# Patient Record
Sex: Female | Born: 1990 | Race: White | Hispanic: No | Marital: Married | Smoking: Never smoker
Health system: Southern US, Community
[De-identification: ages and names within clinical notes are randomized; demographics above are authoritative.]

## PROBLEM LIST (undated history)

## (undated) DIAGNOSIS — R011 Cardiac murmur, unspecified: Secondary | ICD-10-CM

## (undated) DIAGNOSIS — O24419 Gestational diabetes mellitus in pregnancy, unspecified control: Secondary | ICD-10-CM

## (undated) DIAGNOSIS — G43909 Migraine, unspecified, not intractable, without status migrainosus: Secondary | ICD-10-CM

## (undated) DIAGNOSIS — R87629 Unspecified abnormal cytological findings in specimens from vagina: Secondary | ICD-10-CM

## (undated) DIAGNOSIS — Z8759 Personal history of other complications of pregnancy, childbirth and the puerperium: Secondary | ICD-10-CM

## (undated) DIAGNOSIS — I10 Essential (primary) hypertension: Secondary | ICD-10-CM

## (undated) DIAGNOSIS — R609 Edema, unspecified: Secondary | ICD-10-CM

## (undated) HISTORY — DX: Edema, unspecified: R60.9

## (undated) HISTORY — DX: Unspecified abnormal cytological findings in specimens from vagina: R87.629

## (undated) HISTORY — PX: WRIST SURGERY: SHX841

## (undated) HISTORY — DX: Gestational diabetes mellitus in pregnancy, unspecified control: O24.419

## (undated) HISTORY — PX: WISDOM TOOTH EXTRACTION: SHX21

## (undated) HISTORY — DX: Personal history of other complications of pregnancy, childbirth and the puerperium: Z87.59

## (undated) HISTORY — PX: SHOULDER SURGERY: SHX246

## (undated) HISTORY — DX: Cardiac murmur, unspecified: R01.1

## (undated) HISTORY — DX: Migraine, unspecified, not intractable, without status migrainosus: G43.909

---
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
## 2010-12-19 ENCOUNTER — Encounter: Payer: Self-pay | Admitting: Surgery

## 2011-01-02 ENCOUNTER — Encounter: Payer: Self-pay | Admitting: Surgery

## 2012-06-15 ENCOUNTER — Emergency Department: Payer: Self-pay | Admitting: Internal Medicine

## 2014-01-04 DIAGNOSIS — G43009 Migraine without aura, not intractable, without status migrainosus: Secondary | ICD-10-CM | POA: Insufficient documentation

## 2014-07-26 ENCOUNTER — Other Ambulatory Visit: Payer: Self-pay | Admitting: Physician Assistant

## 2014-07-26 DIAGNOSIS — R1011 Right upper quadrant pain: Secondary | ICD-10-CM

## 2014-07-27 ENCOUNTER — Ambulatory Visit
Admission: RE | Admit: 2014-07-27 | Discharge: 2014-07-27 | Disposition: A | Discharge status: Home / Self Care | Payer: BLUE CROSS/BLUE SHIELD | Source: Ambulatory Visit | Attending: Physician Assistant | Admitting: Physician Assistant

## 2014-07-27 DIAGNOSIS — R1011 Right upper quadrant pain: Secondary | ICD-10-CM | POA: Diagnosis present

## 2015-08-23 ENCOUNTER — Ambulatory Visit (INDEPENDENT_AMBULATORY_CARE_PROVIDER_SITE_OTHER): Payer: BLUE CROSS/BLUE SHIELD | Admitting: Obstetrics and Gynecology

## 2015-08-23 ENCOUNTER — Encounter: Payer: Self-pay | Admitting: Obstetrics and Gynecology

## 2015-08-23 VITALS — BP 106/69 | HR 101 | Ht 65.0 in | Wt 191.7 lb

## 2015-08-23 DIAGNOSIS — R8789 Other abnormal findings in specimens from female genital organs: Secondary | ICD-10-CM

## 2015-08-23 DIAGNOSIS — N870 Mild cervical dysplasia: Secondary | ICD-10-CM

## 2015-08-23 DIAGNOSIS — Z308 Encounter for other contraceptive management: Secondary | ICD-10-CM

## 2015-08-23 DIAGNOSIS — B977 Papillomavirus as the cause of diseases classified elsewhere: Secondary | ICD-10-CM

## 2015-08-23 DIAGNOSIS — IMO0002 Reserved for concepts with insufficient information to code with codable children: Secondary | ICD-10-CM | POA: Insufficient documentation

## 2015-08-23 NOTE — Progress Notes (Signed)
ANNUAL PREVENTATIVE CARE GYN  ENCOUNTER NOTE  Subjective:       Brandi Higgins is a 25 y.o. G0P0000 female here for a routine annual gynecologic exam.  Current complaints: 1.  Abnormal paps  2.   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 Previously followed by Dr. Luella Cookosenow at [facility]  Patient has been on oral contraceptives since high school. She is consistent in taking pills on a daily basis and wishes to move to an alternative long-term contraceptive.     Gynecologic History Patient's last menstrual period was 08/19/2015 (exact date). Contraception: OCP (estrogen/progesterone) Last Pap: 03/2015 neg/pos- WS. Results were: normal Last mammogram: n/a. Results were:  Menarche-age 62 Interval-monthly Duration-2-3 days on OCPs; 4 days off of OCPs Dysmenorrhea-negative Dyspareunia-negative STI history-negative except for HPV Currently in a monogamous relationship for the past 2-1/2 years  Obstetric History OB History  Gravida Para Term Preterm AB Living  0 0 0 0 0 0  SAB TAB Ectopic Multiple Live Births  0 0 0 0 0        Past Medical History:  Diagnosis Date  . Abnormal Pap smear of vagina   . Heart murmur   . Migraines   . Swelling     Past Surgical History:  Procedure Laterality Date  . SHOULDER SURGERY    . WISDOM TOOTH EXTRACTION    . WRIST SURGERY      No current outpatient prescriptions on file prior to visit.   No current facility-administered medications on file prior to visit.     No  Known Allergies  Social History   Social History  . Marital status: Single    Spouse name: N/A  . Number of children: N/A  . Years of education: N/A   Occupational History  . Not on file.   Social History Main Topics  . Smoking status: Not on file  . Smokeless tobacco: Not on file  . Alcohol use Not on file  . Drug use: Unknown  . Sexual activity: Not on file   Other Topics Concern  . Not on file   Social History Narrative  . No narrative on file    No family history on file.  The following portions of the patient's history were reviewed and updated as appropriate: allergies, current medications, past family history, past medical history, past social history, past surgical history and problem list.  Review of Systems ROS Menstrual cycles regular No history of dysmenorrhea or dyspareunia Inconsistent in taking OCPs No history of STI's No history of abnormal uterine bleeding No history of vaginal discharge   Objective:   BP 106/69   Pulse (!) 101   Ht 5\' 5"  (1.651 m)   Wt 191 lb 11.2 oz (87 kg)   LMP 08/19/2015 (Exact Date)   BMI 31.90 kg/m  CONSTITUTIONAL: Well-developed, well-nourished female in no acute distress.  Physical exam-deferred  Assessment:   1. Abnormal finding on Pap smear, HPV DNA positive  2. Encounter for other contraceptive management  3. Mild dysplasia of cervix      Plan:   1. Colposcopy with biopsies-08/25/2015 2. Mirena IUD insertion-09/21/2015 3. Long-acting contraceptive management options were discussed in detail 4. Management  of dysplasia was reviewed.  A total of 30 minutes were spent face-to-face with the patient during the encounter with greater than 50% dealing with counseling and coordination of care.  Herold HarmsMartin A Defrancesco, MD

## 2015-08-23 NOTE — Patient Instructions (Signed)
1. Colposcopy-08/25/2015 at 7:30 AM. 2. Take 800 mg ibuprofen or 2 Aleve prior to procedure 3. IUD insertion-09/21/2015

## 2015-08-25 ENCOUNTER — Ambulatory Visit (INDEPENDENT_AMBULATORY_CARE_PROVIDER_SITE_OTHER): Payer: BLUE CROSS/BLUE SHIELD | Admitting: Obstetrics and Gynecology

## 2015-08-25 ENCOUNTER — Encounter: Payer: Self-pay | Admitting: Obstetrics and Gynecology

## 2015-08-25 VITALS — BP 132/80 | HR 97 | Wt 190.3 lb

## 2015-08-25 DIAGNOSIS — IMO0002 Reserved for concepts with insufficient information to code with codable children: Secondary | ICD-10-CM

## 2015-08-25 DIAGNOSIS — B977 Papillomavirus as the cause of diseases classified elsewhere: Secondary | ICD-10-CM

## 2015-08-25 DIAGNOSIS — N870 Mild cervical dysplasia: Secondary | ICD-10-CM

## 2015-08-25 DIAGNOSIS — R888 Abnormal findings in other body fluids and substances: Secondary | ICD-10-CM

## 2015-08-25 NOTE — Progress Notes (Signed)
PROCEDURE: Colposcopy of upper adjacent vagina with cervical biopsies Indications: 1. High-risk HPV Pap smear 2. History of LGSIL Pap  Findings: Adequate colposcopy with visualization of the SCJ Thin rim of white epithelium from 12:00 through 1:00 through 3:00 Nabothian cyst at 9:00  Biopsies: Pap smear Cervix 12:00 Cervix 3:00  Description: Normal consent is obtained. Patient was placed in dorsal lithotomy position Graves' speculum was placed into the vagina to facilitate cervix visualization. Pap smear is obtained.  Acetic acid is applied to cervix and vagina. Colposcopic was used to visualize abnormal lesions as noted above using both normal and filter lenses. Cervical biopsy at 12:00 and 3:00 is obtained with Tischler forceps. Monsel solution is applied. Blood loss is minimal. Complications none. Procedure was well-tolerated.  Assessment: 1. 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 History of LGSIL Pap  PLAN: 1. Pap smear 2. Cervical biopsy 12:00 3. Cervical biopsy 3:00 4. Findings will be given to patient when available 5. Return in 6 months for repeat colposcopy 6. Patient is to return for IUD insertion on 09/21/2015  Herold HarmsMartin A Emerly Prak, MD  Note: This dictation was prepared with Dragon dictation along with smaller phrase technology. Any transcriptional errors that result from this process are unintentional.

## 2015-08-25 NOTE — Patient Instructions (Signed)
Colposcopy, Care After Refer to this sheet in the next few weeks. These instructions provide you with information on caring for yourself after your procedure. Your health care provider may also give you more specific instructions. Your treatment has been planned according to current medical practices, but problems sometimes occur. Call your health care provider if you have any problems or questions after your procedure. WHAT TO EXPECT AFTER THE PROCEDURE  After your procedure, it is typical to have the following:  Cramping. This often goes away in a few minutes.  Soreness. This may last for 2 days.  Lightheadedness. Lie down for a few minutes if this occurs. You may also have some bleeding or dark discharge for a few days. You may need to wear a sanitary pad during this time. HOME CARE INSTRUCTIONS  Avoid sex, douching, and using tampons for 3 days or as directed by your health care provider.  Only take over-the-counter or prescription medicines as directed by your health care provider. Do not take aspirin because it can cause bleeding.  Continue to take birth control pills if you are on them.  Not all test results are available during your visit. If your test results are not back during the visit, make an appointment with your health care provider to find out the results. Do not assume everything is normal if you have not heard from your health care provider or the medical facility. It is important for you to follow up on all of your test results.  Follow your health care provider's advice regarding activity, follow-up visits, and follow-up Pap tests. SEEK MEDICAL CARE IF:  You develop a rash.  You have problems with your medicine. SEEK IMMEDIATE MEDICAL CARE IF:  You are bleeding heavily or are passing blood clots.  You have a fever.  You have abnormal vaginal discharge.  You are having cramps that do not go away after taking your pain medicine.  You feel lightheaded, dizzy, or  faint.  You have stomach pain.   This information is not intended to replace advice given to you by your health care provider. Make sure you discuss any questions you have with your health care provider.   Document Released: 10/08/2012 Document Reviewed: 10/08/2012 Elsevier Interactive Patient Education [employer]2016 Elsevier Inc.   1. Follow-up in 6 months for colposcopy

## 2015-08-29 ENCOUNTER — Encounter: Payer: Self-pay | Admitting: Unknown Physician Specialty

## 2015-08-29 ENCOUNTER — Encounter: Payer: Self-pay | Admitting: Obstetrics and Gynecology

## 2015-08-30 LAB — PAP IG W/ RFLX HPV ASCU: PAP Smear Comment: 0

## 2015-08-31 LAB — PATHOLOGY

## 2015-09-20 ENCOUNTER — Telehealth: Payer: Self-pay | Admitting: Obstetrics and Gynecology

## 2015-09-20 MED ORDER — NORGESTIM-ETH ESTRAD TRIPHASIC 0.18/0.215/0.25 MG-35 MCG PO TABS: 1.0000 | ORAL_TABLET | Freq: Every day | ORAL | 11 refills | Status: DC

## 2015-09-20 NOTE — Telephone Encounter (Signed)
PT HAD AN APPT TOMORROW FOR IUD INSERTION AND SHE HAD TO GO OUT OF TOWN UNEXPECTED AND SHE WANTED TO KNOW IF BC CAN BE CALLED IN FOR HER SHE USES [facility] AND SHE SAID THAT [facility] PUT HER ON ORTHO TRI CYCLEN SO SHE WANTED TO SEE IF THAT CAN BE CALLED IN.

## 2015-09-20 NOTE — Telephone Encounter (Signed)
Pt aware ok per mad to erx ortho tri cyclen. Med erx. Pt is currently taking. Will call back when she is ready for iud insertion.

## 2015-09-21 ENCOUNTER — Ambulatory Visit: Payer: BLUE CROSS/BLUE SHIELD | Admitting: Obstetrics and Gynecology

## 2016-05-23 DIAGNOSIS — R0683 Snoring: Secondary | ICD-10-CM | POA: Diagnosis not present

## 2016-05-23 DIAGNOSIS — R0789 Other chest pain: Secondary | ICD-10-CM | POA: Diagnosis not present

## 2016-05-23 DIAGNOSIS — R51 Headache: Secondary | ICD-10-CM | POA: Diagnosis not present

## 2016-06-25 DIAGNOSIS — J019 Acute sinusitis, unspecified: Secondary | ICD-10-CM | POA: Diagnosis not present

## 2016-06-27 DIAGNOSIS — R0683 Snoring: Secondary | ICD-10-CM | POA: Diagnosis not present

## 2016-07-01 DIAGNOSIS — G4733 Obstructive sleep apnea (adult) (pediatric): Secondary | ICD-10-CM | POA: Diagnosis not present

## 2016-07-09 ENCOUNTER — Other Ambulatory Visit: Payer: Self-pay | Admitting: Obstetrics and Gynecology

## 2016-11-08 ENCOUNTER — Other Ambulatory Visit: Payer: Self-pay

## 2016-11-21 ENCOUNTER — Encounter: Payer: Self-pay | Admitting: Obstetrics and Gynecology

## 2016-11-21 ENCOUNTER — Ambulatory Visit (INDEPENDENT_AMBULATORY_CARE_PROVIDER_SITE_OTHER): Payer: BLUE CROSS/BLUE SHIELD | Admitting: Obstetrics and Gynecology

## 2016-11-21 VITALS — BP 113/72 | HR 79 | Ht 65.0 in | Wt 199.4 lb

## 2016-11-21 DIAGNOSIS — K219 Gastro-esophageal reflux disease without esophagitis: Secondary | ICD-10-CM | POA: Diagnosis not present

## 2016-11-21 DIAGNOSIS — N870 Mild cervical dysplasia: Secondary | ICD-10-CM

## 2016-11-21 DIAGNOSIS — Z308 Encounter for other contraceptive management: Secondary | ICD-10-CM | POA: Diagnosis not present

## 2016-11-21 DIAGNOSIS — Z01419 Encounter for gynecological examination (general) (routine) without abnormal findings: Secondary | ICD-10-CM | POA: Diagnosis not present

## 2016-11-21 DIAGNOSIS — R600 Localized edema: Secondary | ICD-10-CM | POA: Diagnosis not present

## 2016-11-21 DIAGNOSIS — G43719 Chronic migraine without aura, intractable, without status migrainosus: Secondary | ICD-10-CM | POA: Diagnosis not present

## 2016-11-21 MED ORDER — NORGESTIM-ETH ESTRAD TRIPHASIC 0.18/0.215/0.25 MG-25 MCG PO TABS: 1.0000 | ORAL_TABLET | Freq: Every day | ORAL | 3 refills | Status: DC

## 2016-11-21 NOTE — Patient Instructions (Signed)
1.  Pap smear is done 2.  Self breast awareness is encouraged 3.  Screening labs are to be obtained through primary care 4.  Refill birth control pills 5.  Continue with healthy eating, exercise, and control weight loss 6.  Return in 1 year for annual exam  Health Maintenance, Female Adopting a healthy lifestyle and getting preventive care can go a long way to promote health and wellness. Talk with your health care provider about what schedule of regular examinations is right for you. This is a good chance for you to check in with your provider about disease prevention and staying healthy. In between checkups, there are plenty of things you can do on your own. Experts have done a lot of research about which lifestyle changes and preventive measures are most likely to keep you healthy. Ask your health care provider for more information. Weight and diet Eat a healthy diet  Be sure to include plenty of vegetables, fruits, low-fat dairy products, and lean protein.  Do not eat a lot of foods high in solid fats, added sugars, or salt.  Get regular exercise. This is one of the most important things you can do for your health. ? Most adults should exercise for at least 150 minutes each week. The exercise should increase your heart rate and make you sweat (moderate-intensity exercise). ? Most adults should also do strengthening exercises at least twice a week. This is in addition to the moderate-intensity exercise.  Maintain a healthy weight  Body mass index (BMI) is a measurement that can be used to identify possible weight problems. It estimates body fat based on height and weight. Your health care provider can help determine your BMI and help you achieve or maintain a healthy weight.  For females 38 years of age and older: ? A BMI below 18.5 is considered underweight. ? A BMI of 18.5 to 24.9 is normal. ? A BMI of 25 to 29.9 is considered overweight. ? A BMI of 30 and above is considered  obese.  Watch levels of cholesterol and blood lipids  You should start having your blood tested for lipids and cholesterol at 28 years of age, then have this test every 5 years.  You may need to have your cholesterol levels checked more often if: ? Your lipid or cholesterol levels are high. ? You are older than 28 years of age. ? You are at high risk for heart disease.  Cancer screening Lung Cancer  Lung cancer screening is recommended for adults 73-12 years old who are at high risk for lung cancer because of a history of smoking.  A yearly low-dose CT scan of the lungs is recommended for people who: ? Currently smoke. ? Have quit within the past 15 years. ? Have at least a 30-pack-year history of smoking. A pack year is smoking an average of one pack of cigarettes a day for 1 year.  Yearly screening should continue until it has been 15 years since you quit.  Yearly screening should stop if you develop a health problem that would prevent you from having lung cancer treatment.  Breast Cancer  Practice breast self-awareness. This means understanding how your breasts normally appear and feel.  It also means doing regular breast self-exams. Let your health care provider know about any changes, no matter how small.  If you are in your 20s or 30s, you should have a clinical breast exam (CBE) by a health care provider every 1-3 years as part of  a regular health exam.  If you are 19 or older, have a CBE every year. Also consider having a breast X-ray (mammogram) every year.  If you have a family history of breast cancer, talk to your health care provider about genetic screening.  If you are at high risk for breast cancer, talk to your health care provider about having an MRI and a mammogram every year.  Breast cancer gene (BRCA) assessment is recommended for women who have family members with BRCA-related cancers. BRCA-related cancers  include: ? Breast. ? Ovarian. ? Tubal. ? Peritoneal cancers.  Results of the assessment will determine the need for genetic counseling and BRCA1 and BRCA2 testing.  Cervical Cancer Your health care provider may recommend that you be screened regularly for cancer of the pelvic organs (ovaries, uterus, and vagina). This screening involves a pelvic examination, including checking for microscopic changes to the surface of your cervix (Pap test). You may be encouraged to have this screening done every 3 years, beginning at age 34.  For women ages 4-65, health care providers may recommend pelvic exams and Pap testing every 3 years, or they may recommend the Pap and pelvic exam, combined with testing for human papilloma virus (HPV), every 5 years. Some types of HPV increase your risk of cervical cancer. Testing for HPV may also be done on women of any age with unclear Pap test results.  Other health care providers may not recommend any screening for nonpregnant women who are considered low risk for pelvic cancer and who do not have symptoms. Ask your health care provider if a screening pelvic exam is right for you.  If you have had past treatment for cervical cancer or a condition that could lead to cancer, you need Pap tests and screening for cancer for at least 20 years after your treatment. If Pap tests have been discontinued, your risk factors (such as having a new sexual partner) need to be reassessed to determine if screening should resume. Some women have medical problems that increase the chance of getting cervical cancer. In these cases, your health care provider may recommend more frequent screening and Pap tests.  Colorectal Cancer  This type of cancer can be detected and often prevented.  Routine colorectal cancer screening usually begins at 28 years of age and continues through 28 years of age.  Your health care provider may recommend screening at an earlier age if you have risk factors  for colon cancer.  Your health care provider may also recommend using home test kits to check for hidden blood in the stool.  A small camera at the end of a tube can be used to examine your colon directly (sigmoidoscopy or colonoscopy). This is done to check for the earliest forms of colorectal cancer.  Routine screening usually begins at age 37.  Direct examination of the colon should be repeated every 5-10 years through 28 years of age. However, you may need to be screened more often if early forms of precancerous polyps or small growths are found.  Skin Cancer  Check your skin from head to toe regularly.  Tell your health care provider about any new moles or changes in moles, especially if there is a change in a mole's shape or color.  Also tell your health care provider if you have a mole that is larger than the size of a pencil eraser.  Always use sunscreen. Apply sunscreen liberally and repeatedly throughout the day.  Protect yourself by wearing long sleeves,  pants, a wide-brimmed hat, and sunglasses whenever you are outside.  Heart disease, diabetes, and high blood pressure  High blood pressure causes heart disease and increases the risk of stroke. High blood pressure is more likely to develop in: ? People who have blood pressure in the high end of the normal range (130-139/85-89 mm Hg). ? People who are overweight or obese. ? People who are African American.  If you are 18-39 years of age, have your blood pressure checked every 3-5 years. If you are 40 years of age or older, have your blood pressure checked every year. You should have your blood pressure measured twice-once when you are at a hospital or clinic, and once when you are not at a hospital or clinic. Record the average of the two measurements. To check your blood pressure when you are not at a hospital or clinic, you can use: ? An automated blood pressure machine at a pharmacy. ? A home blood pressure monitor.  If  you are between 55 years and 79 years old, ask your health care provider if you should take aspirin to prevent strokes.  Have regular diabetes screenings. This involves taking a blood sample to check your fasting blood sugar level. ? If you are at a normal weight and have a low risk for diabetes, have this test once every three years after 28 years of age. ? If you are overweight and have a high risk for diabetes, consider being tested at a younger age or more often. Preventing infection Hepatitis B  If you have a higher risk for hepatitis B, you should be screened for this virus. You are considered at high risk for hepatitis B if: ? You were born in a country where hepatitis B is common. Ask your health care provider which countries are considered high risk. ? Your parents were born in a high-risk country, and you have not been immunized against hepatitis B (hepatitis B vaccine). ? You have HIV or AIDS. ? You use needles to inject street drugs. ? You live with someone who has hepatitis B. ? You have had sex with someone who has hepatitis B. ? You get hemodialysis treatment. ? You take certain medicines for conditions, including cancer, organ transplantation, and autoimmune conditions.  Hepatitis C  Blood testing is recommended for: ? Everyone born from 1945 through 1965. ? Anyone with known risk factors for hepatitis C.  Sexually transmitted infections (STIs)  You should be screened for sexually transmitted infections (STIs) including gonorrhea and chlamydia if: ? You are sexually active and are younger than 28 years of age. ? You are older than 28 years of age and your health care provider tells you that you are at risk for this type of infection. ? Your sexual activity has changed since you were last screened and you are at an increased risk for chlamydia or gonorrhea. Ask your health care provider if you are at risk.  If you do not have HIV, but are at risk, it may be recommended  that you take a prescription medicine daily to prevent HIV infection. This is called pre-exposure prophylaxis (PrEP). You are considered at risk if: ? You are sexually active and do not regularly use condoms or know the HIV status of your partner(s). ? You take drugs by injection. ? You are sexually active with a partner who has HIV.  Talk with your health care provider about whether you are at high risk of being infected with HIV. If you   choose to begin PrEP, you should first be tested for HIV. You should then be tested every 3 months for as long as you are taking PrEP. Pregnancy  If you are premenopausal and you may become pregnant, ask your health care provider about preconception counseling.  If you may become pregnant, take 400 to 800 micrograms (mcg) of folic acid every day.  If you want to prevent pregnancy, talk to your health care provider about birth control (contraception). Osteoporosis and menopause  Osteoporosis is a disease in which the bones lose minerals and strength with aging. This can result in serious bone fractures. Your risk for osteoporosis can be identified using a bone density scan.  If you are 65 years of age or older, or if you are at risk for osteoporosis and fractures, ask your health care provider if you should be screened.  Ask your health care provider whether you should take a calcium or vitamin D supplement to lower your risk for osteoporosis.  Menopause may have certain physical symptoms and risks.  Hormone replacement therapy may reduce some of these symptoms and risks. Talk to your health care provider about whether hormone replacement therapy is right for you. Follow these instructions at home:  Schedule regular health, dental, and eye exams.  Stay current with your immunizations.  Do not use any tobacco products including cigarettes, chewing tobacco, or electronic cigarettes.  If you are pregnant, do not drink alcohol.  If you are  breastfeeding, limit how much and how often you drink alcohol.  Limit alcohol intake to no more than 1 drink per day for nonpregnant women. One drink equals 12 ounces of beer, 5 ounces of wine, or 1 ounces of hard liquor.  Do not use street drugs.  Do not share needles.  Ask your health care provider for help if you need support or information about quitting drugs.  Tell your health care provider if you often feel depressed.  Tell your health care provider if you have ever been abused or do not feel safe at home. This information is not intended to replace advice given to you by your health care provider. Make sure you discuss any questions you have with your health care provider. Document Released: 07/03/2010 Document Revised: 05/26/2015 Document Reviewed: 09/21/2014 Elsevier Interactive Patient Education  [employer].

## 2016-11-21 NOTE — Progress Notes (Signed)
ANNUAL PREVENTATIVE CARE GYN  ENCOUNTER NOTE  Subjective:       Brandi Higgins is a 26 y.o. G0P0000 female here for a routine annual gynecologic exam.  Current complaints: 1. None 2.  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   Gynecologic History Patient's last menstrual period was 11/21/2016 (exact date). Contraception: OCP (estrogen/progesterone) Last Pap: 08/26/2015 neg. Results were: normal Last mammogram: n/a. Results were:   Obstetric History OB History  Gravida Para Term Preterm AB Living  0 0 0 0 0 0  SAB TAB Ectopic Multiple Live Births  0 0 0 0 0        Past Medical History:  Diagnosis Date  . Abnormal Pap smear of vagina   . Heart murmur   . Migraines   . Swelling     Past Surgical History:  Procedure Laterality Date  . SHOULDER SURGERY    . WISDOM TOOTH EXTRACTION    . WRIST SURGERY      Current Outpatient Medications on File Prior to Visit  Medication Sig Dispense Refill  . furosemide (LASIX) 20 MG tablet Take by mouth.    . Norgestimate-Ethinyl Estradiol Triphasic 0.18/0.215/0.25 MG-25 MCG tab Take by mouth.     No current facility-administered medications on file prior to visit.     No Known Allergies  Social History   Socioeconomic History  . Marital status: Single    Spouse name: Not on file  . Number of children: Not on file  . Years of education: Not on file  . Highest education level: Not on file  Social Needs  . Financial resource strain: Not on file  . Food insecurity - worry: Not on file  . Food insecurity - inability: Not on file  . Transportation needs - medical: Not on file  . Transportation needs - non-medical: Not on file  Occupational History  . Not on file  Tobacco Use  . Smoking status: Never Smoker  . Smokeless tobacco: Never Used  Substance and Sexual Activity  . Alcohol use: Not on file    Comment: occas  . Drug use: No  . Sexual activity: Yes    Birth  control/protection: Pill  Other Topics Concern  . Not on file  Social History Narrative  . Not on file    Family History  Problem Relation Age of Onset  . Diabetes Maternal Grandmother   . Diabetes Paternal Grandmother   . Cancer Neg Hx   . Heart disease Neg Hx     The following portions of the patient's history were reviewed and updated as appropriate: allergies, current medications, past family history, past medical history, past social history, past surgical history and problem list.  Review of Systems ROS Review of Systems - General ROS: negative for - chills, fatigue, fever, hot flashes, night sweats, weight gain or weight loss Psychological ROS: negative for - anxiety, decreased libido, depression, mood swings, physical abuse or sexual abuse Ophthalmic ROS: negative for - blurry vision, eye pain or loss of vision ENT ROS: negative for - headaches, hearing change, visual changes or vocal changes Allergy and Immunology ROS: negative for - hives, itchy/watery eyes or seasonal allergies Hematological and Lymphatic ROS: negative for - bleeding problems, bruising, swollen lymph nodes or weight loss Endocrine ROS: negative for - galactorrhea, hair pattern changes, hot flashes, malaise/lethargy, mood swings, palpitations, polydipsia/polyuria, skin changes, temperature intolerance or unexpected weight changes Breast ROS: negative for - new or changing breast lumps  or nipple discharge Respiratory ROS: negative for - cough or shortness of breath Cardiovascular ROS: negative for - chest pain, irregular heartbeat, palpitations or shortness of breath Gastrointestinal ROS: no abdominal pain, change in bowel habits, or black or bloody stools Genito-Urinary ROS: no dysuria, trouble voiding, or hematuria Musculoskeletal ROS: negative for - joint pain or joint stiffness Neurological ROS: negative for - bowel and bladder control changes Dermatological ROS: negative for rash and skin lesion  changes   Objective:   BP 113/72   Pulse 79   Ht 5\' 5"  (1.651 m)   Wt 199 lb 6.4 oz (90.4 kg)   LMP 11/21/2016 (Exact Date)   BMI 33.18 kg/m  CONSTITUTIONAL: Well-developed, well-nourished female in no acute distress.  PSYCHIATRIC: Normal mood and affect. Normal behavior. Normal judgment and thought content. NEUROLGIC: Alert and oriented to person, place, and time. Normal muscle tone coordination. No cranial nerve deficit noted. HENT:  Normocephalic, atraumatic, External right and left ear normal. Oropharynx is clear and moist EYES: Conjunctivae and EOM are normal. No scleral icterus.  NECK: Normal range of motion, supple, no masses.  Normal thyroid.  SKIN: Skin is warm and dry. No rash noted. Not diaphoretic. No erythema. No pallor. CARDIOVASCULAR: Normal heart rate noted, regular rhythm, no murmur. RESPIRATORY: Clear to auscultation bilaterally. Effort and breath sounds normal, no problems with respiration noted. BREASTS: Symmetric in size. No masses, skin changes, nipple drainage, or lymphadenopathy. ABDOMEN: Soft, normal bowel sounds, no distention noted.  No tenderness, rebound or guarding.  BLADDER: Normal PELVIC:  External Genitalia: Normal  BUS: Normal  Vagina: Normal; menstrual blood in vault  cervix: Normal; no lesions; no cervical motion tenderness  Uterus: Normal; midplane, normal size shape, mobile, nontender  Adnexa: Normal; nonpalpable nontender  RV: External Exam NormaI  MUSCULOSKELETAL: Normal range of motion. No tenderness.  No cyanosis, clubbing, or edema.  2+ distal pulses. LYMPHATIC: No Axillary, Supraclavicular, or Inguinal Adenopathy.    Assessment:   Annual gynecologic examination 26 y.o. Contraception: OCP (estrogen/progesterone) Obesity 1 Problem List Items Addressed This Visit    None    History of cervical dysplasia  Plan:  Pap: Pap, Reflex if ASCUS Mammogram: Not Indicated Stool Guaiac Testing:  Not Indicated Labs: thru pcp Routine  preventative health maintenance measures emphasized: Exercise/Diet/Weight control, Tobacco Warnings and Alcohol/Substance use risks Refill birth control pills Return to Clinic - 1 Year   Crystal AmericusMiller, CMA  Herold HarmsMartin A Defrancesco, MD  Note: This dictation was prepared with Dragon dictation along with smaller phrase technology. Any transcriptional errors that result from this process are unintentional.

## 2016-11-26 LAB — PAP IG W/ RFLX HPV ASCU: PAP SMEAR COMMENT: 0

## 2016-12-12 ENCOUNTER — Other Ambulatory Visit: Payer: Self-pay

## 2016-12-12 MED ORDER — NORGESTIM-ETH ESTRAD TRIPHASIC 0.18/0.215/0.25 MG-35 MCG PO TABS: 1.0000 | ORAL_TABLET | Freq: Every day | ORAL | 3 refills | Status: DC

## 2017-01-23 ENCOUNTER — Encounter: Payer: Self-pay | Admitting: Obstetrics and Gynecology

## 2017-01-23 ENCOUNTER — Other Ambulatory Visit: Payer: Self-pay

## 2017-01-23 ENCOUNTER — Telehealth: Payer: Self-pay | Admitting: Obstetrics and Gynecology

## 2017-01-23 MED ORDER — NORGESTIM-ETH ESTRAD TRIPHASIC 0.18/0.215/0.25 MG-35 MCG PO TABS: 1.0000 | ORAL_TABLET | Freq: Every day | ORAL | 0 refills | Status: DC

## 2017-01-23 NOTE — Telephone Encounter (Addendum)
See my chart message

## 2017-01-23 NOTE — Telephone Encounter (Signed)
The patient called and stated that she is currently out of town and she forgot her B.C. The patient would like to have her B.C refilled or sent to a pharmacy near the location she is now. The patient did not disclose any other information. Please advise.

## 2017-03-26 DIAGNOSIS — G5601 Carpal tunnel syndrome, right upper limb: Secondary | ICD-10-CM | POA: Diagnosis not present

## 2017-04-01 DIAGNOSIS — M25531 Pain in right wrist: Secondary | ICD-10-CM | POA: Diagnosis not present

## 2017-04-01 DIAGNOSIS — R200 Anesthesia of skin: Secondary | ICD-10-CM | POA: Diagnosis not present

## 2017-04-15 ENCOUNTER — Other Ambulatory Visit: Payer: Self-pay | Admitting: Physician Assistant

## 2017-04-15 DIAGNOSIS — G5603 Carpal tunnel syndrome, bilateral upper limbs: Secondary | ICD-10-CM

## 2017-04-18 DIAGNOSIS — M25531 Pain in right wrist: Secondary | ICD-10-CM | POA: Diagnosis not present

## 2017-04-25 DIAGNOSIS — M25531 Pain in right wrist: Secondary | ICD-10-CM | POA: Diagnosis not present

## 2017-04-26 ENCOUNTER — Ambulatory Visit: Payer: BLUE CROSS/BLUE SHIELD

## 2017-06-06 DIAGNOSIS — M25531 Pain in right wrist: Secondary | ICD-10-CM | POA: Diagnosis not present

## 2017-08-19 DIAGNOSIS — M79672 Pain in left foot: Secondary | ICD-10-CM | POA: Diagnosis not present

## 2017-08-19 DIAGNOSIS — M79671 Pain in right foot: Secondary | ICD-10-CM | POA: Diagnosis not present

## 2017-08-19 DIAGNOSIS — M722 Plantar fascial fibromatosis: Secondary | ICD-10-CM | POA: Diagnosis not present

## 2017-12-04 DIAGNOSIS — H6123 Impacted cerumen, bilateral: Secondary | ICD-10-CM | POA: Diagnosis not present

## 2017-12-04 DIAGNOSIS — H938X1 Other specified disorders of right ear: Secondary | ICD-10-CM | POA: Diagnosis not present

## 2017-12-06 ENCOUNTER — Other Ambulatory Visit: Payer: Self-pay | Admitting: Obstetrics and Gynecology

## 2017-12-13 ENCOUNTER — Encounter: Payer: Self-pay | Admitting: Obstetrics and Gynecology

## 2017-12-13 ENCOUNTER — Ambulatory Visit (INDEPENDENT_AMBULATORY_CARE_PROVIDER_SITE_OTHER): Payer: BLUE CROSS/BLUE SHIELD | Admitting: Obstetrics and Gynecology

## 2017-12-13 VITALS — BP 128/84 | HR 88 | Ht 65.0 in | Wt 204.6 lb

## 2017-12-13 DIAGNOSIS — E669 Obesity, unspecified: Secondary | ICD-10-CM | POA: Diagnosis not present

## 2017-12-13 DIAGNOSIS — Z01419 Encounter for gynecological examination (general) (routine) without abnormal findings: Secondary | ICD-10-CM | POA: Diagnosis not present

## 2017-12-13 MED ORDER — NORGESTIM-ETH ESTRAD TRIPHASIC 0.18/0.215/0.25 MG-35 MCG PO TABS: 1.0000 | ORAL_TABLET | Freq: Every day | ORAL | 4 refills | Status: DC

## 2017-12-13 NOTE — Patient Instructions (Signed)
Preventive Care 18-39 Years, Female Preventive care refers to lifestyle choices and visits with your health care provider that can promote health and wellness. What does preventive care include?  A yearly physical exam. This is also called an annual well check.  Dental exams once or twice a year.  Routine eye exams. Ask your health care provider how often you should have your eyes checked.  Personal lifestyle choices, including: ? Daily care of your teeth and gums. ? Regular physical activity. ? Eating a healthy diet. ? Avoiding tobacco and drug use. ? Limiting alcohol use. ? Practicing safe sex. ? Taking vitamin and mineral supplements as recommended by your health care provider. What happens during an annual well check? The services and screenings done by your health care provider during your annual well check will depend on your age, overall health, lifestyle risk factors, and family history of disease. Counseling Your health care provider may ask you questions about your:  Alcohol use.  Tobacco use.  Drug use.  Emotional well-being.  Home and relationship well-being.  Sexual activity.  Eating habits.  Work and work Statistician.  Method of birth control.  Menstrual cycle.  Pregnancy history.  Screening You may have the following tests or measurements:  Height, weight, and BMI.  Diabetes screening. This is done by checking your blood sugar (glucose) after you have not eaten for a while (fasting).  Blood pressure.  Lipid and cholesterol levels. These may be checked every 5 years starting at age 38.  Skin check.  Hepatitis C blood test.  Hepatitis B blood test.  Sexually transmitted disease (STD) testing.  BRCA-related cancer screening. This may be done if you have a family history of breast, ovarian, tubal, or peritoneal cancers.  Pelvic exam and Pap test. This may be done every 3 years starting at age 38. Starting at age 30, this may be done  every 5 years if you have a Pap test in combination with an HPV test.  Discuss your test results, treatment options, and if necessary, the need for more tests with your health care provider. Vaccines Your health care provider may recommend certain vaccines, such as:  Influenza vaccine. This is recommended every year.  Tetanus, diphtheria, and acellular pertussis (Tdap, Td) vaccine. You may need a Td booster every 10 years.  Varicella vaccine. You may need this if you have not been vaccinated.  HPV vaccine. If you are 39 or younger, you may need three doses over 6 months.  Measles, mumps, and rubella (MMR) vaccine. You may need at least one dose of MMR. You may also need a second dose.  Pneumococcal 13-valent conjugate (PCV13) vaccine. You may need this if you have certain conditions and were not previously vaccinated.  Pneumococcal polysaccharide (PPSV23) vaccine. You may need one or two doses if you smoke cigarettes or if you have certain conditions.  Meningococcal vaccine. One dose is recommended if you are age 68-21 years and a first-year college student living in a residence hall, or if you have one of several medical conditions. You may also need additional booster doses.  Hepatitis A vaccine. You may need this if you have certain conditions or if you travel or work in places where you may be exposed to hepatitis A.  Hepatitis B vaccine. You may need this if you have certain conditions or if you travel or work in places where you may be exposed to hepatitis B.  Haemophilus influenzae type b (Hib) vaccine. You may need this  if you have certain risk factors.  Talk to your health care provider about which screenings and vaccines you need and how often you need them. This information is not intended to replace advice given to you by your health care provider. Make sure you discuss any questions you have with your health care provider. Document Released: 02/13/2001 Document Revised:  09/07/2015 Document Reviewed: 10/19/2014 Elsevier Interactive Patient Education  2018 Elsevier Inc.  

## 2017-12-13 NOTE — Progress Notes (Signed)
  Subjective:     Brandi Higgins is a engaged white 27 y.o. female and is here for a comprehensive physical exam. 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 The patient reports no problems.  Social History   Socioeconomic History  . Marital status: Single    Spouse name: Not on file  . Number of children: Not on file  . Years of education: Not on file  . Highest education level: Not on file  Occupational History  . Not on file  Social Needs  . Financial resource strain: Not on file  . Food insecurity:    Worry: Not on file    Inability: Not on file  . Transportation needs:    Medical: Not on file    Non-medical: Not on file  Tobacco Use  . Smoking status: Never Smoker  . Smokeless tobacco: Never Used  Substance and Sexual Activity  . Alcohol use: Yes    Comment: occas  . Drug use: No  . Sexual activity: Yes    Birth control/protection: Pill  Lifestyle  . Physical activity:    Days per week: 2 days    Minutes per session: 20 min  . Stress: Not on file  Relationships  . Social connections:    Talks on phone: Not on file    Gets together: Not on file    Attends religious service: Not on file    Active member of club or organization: Not on file    Attends meetings of clubs or organizations: Not on file    Relationship status: Not on file  . Intimate partner violence:    Fear of current or ex partner: No    Emotionally abused: No    Physically abused: No    Forced sexual activity: No  Other Topics Concern  . Not on file  Social History Narrative  . Not on file   Health Maintenance  Topic Date Due  . HIV Screening  06/10/2003  . TETANUS/TDAP  06/10/2007  . PAP-Cervical Cytology Screening  06/09/2009  . INFLUENZA VACCINE  04/01/2018 (Originally 08/01/2017)  . PAP SMEAR-Modifier  11/22/2019    The following portions of the patient's history were reviewed and updated as appropriate: allergies, current medications, past family history, past  medical history, past social history, past surgical history and problem list.  Review of Systems A comprehensive review of systems was negative.   Objective:    General appearance: alert, cooperative and appears stated age Neck: no adenopathy, no carotid bruit, no JVD, supple, symmetrical, trachea midline and thyroid not enlarged, symmetric, no tenderness/mass/nodules Lungs: clear to auscultation bilaterally Breasts: normal appearance, no masses or tenderness Heart: regular rate and rhythm, S1, S2 normal, no murmur, click, rub or gallop Abdomen: soft, non-tender; bowel sounds normal; no masses,  no organomegaly Pelvic: cervix normal in appearance, external genitalia normal, no adnexal masses or tenderness, no cervical motion tenderness, rectovaginal septum normal, uterus normal size, shape, and consistency and vagina normal without discharge    Assessment:    Healthy female exam.  OCP use obesity     Plan:  RTC 1 year or as needed.   Melody Shambley,CNM   See After Visit Summary for Counseling Recommendations

## 2017-12-18 LAB — PAP IG AND HPV HIGH-RISK: HPV, high-risk: NEGATIVE

## 2018-07-11 ENCOUNTER — Encounter: Payer: BLUE CROSS/BLUE SHIELD | Admitting: Obstetrics and Gynecology

## 2018-08-28 DIAGNOSIS — M67833 Other specified disorders of tendon, right wrist: Secondary | ICD-10-CM | POA: Diagnosis not present

## 2018-11-21 DIAGNOSIS — H52229 Regular astigmatism, unspecified eye: Secondary | ICD-10-CM | POA: Diagnosis not present

## 2018-11-21 DIAGNOSIS — Z135 Encounter for screening for eye and ear disorders: Secondary | ICD-10-CM | POA: Diagnosis not present

## 2018-12-01 ENCOUNTER — Other Ambulatory Visit: Payer: Self-pay

## 2018-12-01 DIAGNOSIS — Z20822 Contact with and (suspected) exposure to covid-19: Secondary | ICD-10-CM

## 2018-12-02 LAB — NOVEL CORONAVIRUS, NAA: SARS-CoV-2, NAA: NOT DETECTED

## 2019-01-08 ENCOUNTER — Telehealth: Payer: Self-pay | Admitting: Certified Nurse Midwife

## 2019-01-08 NOTE — Telephone Encounter (Signed)
Pt called in and canceled appt said she will does everything at next appt.

## 2019-01-09 ENCOUNTER — Encounter: Payer: Self-pay | Admitting: Certified Nurse Midwife

## 2019-01-13 ENCOUNTER — Telehealth: Payer: Self-pay | Admitting: Certified Nurse Midwife

## 2019-01-28 NOTE — Telephone Encounter (Signed)
error 

## 2019-02-27 ENCOUNTER — Encounter: Payer: Self-pay | Admitting: Certified Nurse Midwife

## 2019-03-04 ENCOUNTER — Encounter: Payer: Self-pay | Admitting: Certified Nurse Midwife

## 2019-03-26 ENCOUNTER — Encounter: Payer: BLUE CROSS/BLUE SHIELD | Admitting: Family Medicine

## 2019-03-30 ENCOUNTER — Emergency Department
Admission: EM | Admit: 2019-03-30 | Discharge: 2019-03-30 | Disposition: A | Discharge status: Home / Self Care | Payer: 59 | Attending: Emergency Medicine | Admitting: Emergency Medicine

## 2019-03-30 ENCOUNTER — Emergency Department: Payer: 59

## 2019-03-30 ENCOUNTER — Other Ambulatory Visit: Payer: Self-pay

## 2019-03-30 DIAGNOSIS — Z79899 Other long term (current) drug therapy: Secondary | ICD-10-CM | POA: Diagnosis not present

## 2019-03-30 DIAGNOSIS — Z3A01 Less than 8 weeks gestation of pregnancy: Secondary | ICD-10-CM | POA: Insufficient documentation

## 2019-03-30 DIAGNOSIS — O209 Hemorrhage in early pregnancy, unspecified: Secondary | ICD-10-CM | POA: Insufficient documentation

## 2019-03-30 DIAGNOSIS — O4690 Antepartum hemorrhage, unspecified, unspecified trimester: Secondary | ICD-10-CM

## 2019-03-30 DIAGNOSIS — O208 Other hemorrhage in early pregnancy: Secondary | ICD-10-CM | POA: Diagnosis not present

## 2019-03-30 DIAGNOSIS — Z3A08 8 weeks gestation of pregnancy: Secondary | ICD-10-CM | POA: Diagnosis not present

## 2019-03-30 LAB — CBC
HCT: 40.8 % (ref 36.0–46.0)
Hemoglobin: 13.8 g/dL (ref 12.0–15.0)
MCH: 29.8 pg (ref 26.0–34.0)
MCHC: 33.8 g/dL (ref 30.0–36.0)
MCV: 88.1 fL (ref 80.0–100.0)
Platelets: 300 10*3/uL (ref 150–400)
RBC: 4.63 MIL/uL (ref 3.87–5.11)
RDW: 12.7 % (ref 11.5–15.5)
WBC: 10 10*3/uL (ref 4.0–10.5)
nRBC: 0.2 % (ref 0.0–0.2)

## 2019-03-30 LAB — BASIC METABOLIC PANEL
Anion gap: 9 (ref 5–15)
BUN: 9 mg/dL (ref 6–20)
CO2: 23 mmol/L (ref 22–32)
Calcium: 8.9 mg/dL (ref 8.9–10.3)
Chloride: 106 mmol/L (ref 98–111)
Creatinine, Ser: 0.79 mg/dL (ref 0.44–1.00)
GFR calc Af Amer: >60 mL/min (ref >60)
GFR calc non Af Amer: >60 mL/min (ref >60)
Glucose, Bld: 120 mg/dL — ABNORMAL HIGH (ref 70–99)
Potassium: 3.4 mmol/L — ABNORMAL LOW (ref 3.5–5.1)
Sodium: 138 mmol/L (ref 135–145)

## 2019-03-30 LAB — HCG, QUANTITATIVE, PREGNANCY: hCG, Beta Chain, Quant, S: 607 m[IU]/mL — ABNORMAL HIGH (ref <5)

## 2019-03-30 LAB — ABO/RH: ABO/RH(D): A POS

## 2019-03-30 LAB — POCT PREGNANCY, URINE: Preg Test, Ur: POSITIVE — AB

## 2019-03-30 NOTE — ED Notes (Signed)
Pt presents to the ED for vaginal bleedings. Denies abd pain. Pt states she think she approx. [redacted] weeks pregnant. States she woke up and was bleeding. She hasn't been using the pads or tampons to measure the bleeding amount. Denies NVD. Pt is A&Ox4 and NAD.

## 2019-03-30 NOTE — ED Provider Notes (Signed)
[facility] Emergency Department Provider Note   ____________________________________________    I have reviewed the triage vital signs and the nursing notes.   HISTORY  Chief Complaint Vaginal Bleeding      HPI Brandi Higgins is a 29 y.o. female who reports she is approximately [redacted] weeks pregnant presents with vaginal bleeding.  Patient reports this is her first pregnancy.  Patient reports bleeding started yesterday, initially was spotting then this morning was slightly heavier.  Denies abdominal cramping or pain.  No nausea or vomiting.  No fever.  No dysuria.  Has not seen her OB yet for initial appointment, scheduled in April.  Past Medical History:  Diagnosis Date  . Abnormal Pap smear of vagina   . Heart murmur   . Migraines   . Swelling     Patient Active Problem List   Diagnosis Date Noted  . 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 Migraine without aura and without status migrainosus, not intractable 01/04/2014    Past Surgical History:  Procedure Laterality Date  . SHOULDER SURGERY    . WISDOM TOOTH EXTRACTION    . WRIST SURGERY      Prior to Admission medications   Medication Sig Start Date End Date Taking? Authorizing Provider  furosemide (LASIX) 20 MG tablet Take by mouth. 05/06/15 11/21/16  [provider]  Norgestimate-Ethinyl Estradiol Triphasic (TRI-PREVIFEM) 0.18/0.215/0.25 MG-35 MCG tablet Take 1 tablet by mouth daily. 12/13/17   Joylene Igo, CNM     Allergies Patient has no known allergies.  Family History  Problem Relation Age of Onset  . Diabetes Maternal Grandmother   . Diabetes Paternal Grandmother   . Cancer Neg Hx   . Heart disease Neg Hx     Social History Social History   Tobacco Use  . Smoking status: Never Smoker  . Smokeless tobacco: Never Used  Substance Use Topics  . Alcohol use: Not Currently    Comment: occas  . Drug use: No    Review of  Systems  Constitutional: No fever/chills Eyes: No visual changes.  ENT: No sore throat. Cardiovascular: Denies chest pain. Respiratory: Denies shortness of breath. Gastrointestinal: As above Genitourinary: As above Musculoskeletal: Negative for back pain. Skin: Negative for rash. Neurological: Negative for headaches or weakness   ____________________________________________   PHYSICAL EXAM:  VITAL SIGNS: ED Triage Vitals  Enc Vitals Group     BP 03/30/19 0816 (!) 153/94     Pulse Rate 03/30/19 0816 (!) 110     Resp 03/30/19 0816 18     Temp 03/30/19 1147 98.6 F (37 C)     Temp Source 03/30/19 1147 Oral     SpO2 03/30/19 0816 98 %     Weight 03/30/19 0817 90.7 kg (200 lb)     Height 03/30/19 0817 1.651 m (5\' 5" )     Head Circumference --      Peak Flow --      Pain Score 03/30/19 0816 1     Pain Loc --      Pain Edu? --      Excl. in [place]? --     Constitutional: Alert and oriented.  Nose: No congestion/rhinnorhea. Mouth/Throat: Mucous membranes are moist.    Cardiovascular: Normal rate, regular rhythm.  Good peripheral circulation. Respiratory: Normal respiratory effort.  No retractions.. Gastrointestinal: Soft and nontender. No distention.  No CVA tenderness.  Reassuring exam Genitourinary: deferred Musculoskeletal: No lower extremity tenderness nor edema.  Warm and well perfused Neurologic:  Normal speech and language. No gross focal neurologic deficits are appreciated.  Skin:  Skin is warm, dry and intact. No rash noted. Psychiatric: Mood and affect are normal. Speech and behavior are normal.  ____________________________________________   LABS (all labs ordered are listed, but only abnormal results are displayed)  Labs Reviewed  BASIC METABOLIC PANEL - Abnormal; Notable for the following components:      Result Value   Potassium 3.4 (*)    Glucose, Bld 120 (*)    All other components within normal limits  HCG, QUANTITATIVE, PREGNANCY - Abnormal;  Notable for the following components:   hCG, Beta Chain, Quant, S 607 (*)    All other components within normal limits  POCT PREGNANCY, URINE - Abnormal; Notable for the following components:   Preg Test, Ur POSITIVE (*)    All other components within normal limits  CBC  ABO/RH   ____________________________________________  EKG  None ____________________________________________  RADIOLOGY  Ultrasound demonstrates probable early intrauterine gestational sac ____________________________________________   PROCEDURES  Procedure(s) performed: No  Procedures   Critical Care performed: No ____________________________________________   INITIAL IMPRESSION / ASSESSMENT AND PLAN / ED COURSE  Pertinent labs & imaging results that were available during my care of the patient were reviewed by me and considered in my medical decision making (see chart for details).  Patient presents with vaginal bleeding for trimester of pregnancy.  hCG is 607, ultrasound demonstrates probable early intrauterine gestational sac.  Differential includes possible cystic bleeding, miscarriage, subchorionic hemorrhage  Patient will need repeat hCG in 2 to 3 days.  She is Rh+.  She will call her OB to schedule for close follow-up.    ____________________________________________   FINAL CLINICAL IMPRESSION(S) / ED DIAGNOSES  Final diagnoses:  Vaginal bleeding in pregnancy        Note:  This document was prepared using Dragon voice recognition software and may include unintentional dictation errors.   Jene Every, MD 03/30/19 1228

## 2019-03-30 NOTE — ED Triage Notes (Signed)
Pt states she is about [redacted] weeks pregnant and woke up to vaginal bleeding with small blots and mild lower abd pain. States this is her first pregnancy.

## 2019-04-01 ENCOUNTER — Other Ambulatory Visit: Payer: 59

## 2019-04-01 ENCOUNTER — Other Ambulatory Visit: Payer: Self-pay

## 2019-04-01 DIAGNOSIS — Z3201 Encounter for pregnancy test, result positive: Secondary | ICD-10-CM | POA: Diagnosis not present

## 2019-04-01 DIAGNOSIS — O3680X0 Pregnancy with inconclusive fetal viability, not applicable or unspecified: Secondary | ICD-10-CM

## 2019-04-02 ENCOUNTER — Telehealth: Payer: Self-pay | Admitting: *Deleted

## 2019-04-02 ENCOUNTER — Inpatient Hospital Stay (HOSPITAL_COMMUNITY)
Admission: AD | Admit: 2019-04-02 | Discharge: 2019-04-02 | Disposition: A | Discharge status: Home / Self Care | Payer: 59 | Attending: Obstetrics & Gynecology | Admitting: Obstetrics & Gynecology

## 2019-04-02 ENCOUNTER — Other Ambulatory Visit: Payer: Self-pay

## 2019-04-02 ENCOUNTER — Encounter (HOSPITAL_COMMUNITY): Payer: Self-pay | Admitting: Obstetrics & Gynecology

## 2019-04-02 DIAGNOSIS — O0281 Inappropriate change in quantitative human chorionic gonadotropin (hCG) in early pregnancy: Secondary | ICD-10-CM | POA: Insufficient documentation

## 2019-04-02 DIAGNOSIS — Z3A01 Less than 8 weeks gestation of pregnancy: Secondary | ICD-10-CM | POA: Diagnosis not present

## 2019-04-02 LAB — BETA HCG QUANT (REF LAB): hCG Quant: 589 m[IU]/mL

## 2019-04-02 NOTE — MAU Note (Signed)
Went to the ER on Mon, because she was bleeding.  Had blood work and [place]. Had blood work repeated on Wed. Level did not change much.  Was told to come in if increased pain or bleeding.  Come tomorrow for repeat level. Is bleeding more today.  Feels fine, denies pain. "is in the dark, as to what is going on".

## 2019-04-02 NOTE — MAU Provider Note (Signed)
First Provider Initiated Contact with Patient 04/02/19 1709      S [name] is a 29 y.o. G18P0000 non-pregnant female who presents to MAU today for labs. She is early pregnant & has had HCGs checked this week in the hospital & the office ([facility]). HCG on Monday was 607, & yesterday was 589. Ultrasound on Monday showed not pregnancy or ectopic.  Patient was instructed today that she needs to come to MAU tomorrow for blood work. She came today because she didn't understand what her labs meant & what she needed to do.  Denies abdominal pain. Reports slight increase in bleeding but only needs to change her pad twice per day. Not saturating or passing clots.    O BP 133/82 (BP Location: Right Arm)   Pulse 89   Temp 98.4 F (36.9 C) (Oral)   Resp 17   Ht 5\' 5"  (1.651 m)   Wt 97.1 kg   LMP 02/02/2019 (Exact Date)   SpO2 98%   BMI 35.61 kg/m  Physical Exam  Nursing note and vitals reviewed. Constitutional: She appears well-developed and well-nourished. No distress.  Respiratory: Effort normal. No respiratory distress.  Skin: She is not diaphoretic.  Psychiatric: She has a normal mood and affect. Her behavior is normal. Thought content normal.    MDM No pain. Vital signs stable. Bleeding stable.  Had discussion with patient regarding implications of previous HCG levels. They are not rising, so we know the pregnancy is not viable - but unsure at this point if miscarriage vs ectopic. Educated regarding what ectopic pregnancy meant, and different treatment options if that's what her diagnosis is.  Gave patient option of repeat labs today vs returning tomorrow at the 48 hr point for HCG per Dr. 04/02/2019 recommendation. Patient will return tomorrow for labs.  Reviewed ectopic precautions & reasons to return.   A 1. Inappropriate change in quantitative hCG in early pregnancy      P Discharge home in stable condition Strict return precautions reviewed Patient to return to MAU  tomorrow for stat HCG Given educational material on ectopic pregnancy & methotrexate treatment for patient to review - this is not her diagnosis at this time.   Mont Dutton, NP 04/02/2019 6:49 PM

## 2019-04-02 NOTE — Discharge Instructions (Signed)
Methotrexate Treatment for an Ectopic Pregnancy  Methotrexate is a medicine that treats an ectopic pregnancy. An ectopic pregnancy is a pregnancy in which the fetus develops outside the uterus. This kind of pregnancy can be dangerous. Methotrexate works by stopping the growth of the fertilized egg. It also helps your body absorb tissue from the egg. This takes between 2-6 weeks. Most ectopic pregnancies can be successfully treated with methotrexate if they are diagnosed early. Tell a health care provider about:  Any allergies you have.  All medicines you are taking, including vitamins, herbs, eye drops, creams, and over-the-counter medicines.  Any medical conditions you have. What are the risks? Generally, this is a safe treatment. However, problems may occur, including:  Nausea or vomiting or both.  Vaginal bleeding or spotting.  Diarrhea.  Abdominal cramping.  Dizziness or feeling lightheaded.  Mouth sores.  Swelling or irritation of the lining of your lungs (pneumonitis).  Liver damage.  Hair loss. There is a risk that methotrexate treatment will fail and your pregnancy will continue. There is also a risk that the ectopic pregnancy might rupture while you are using this medicine. What happens before the procedure?  Liver tests, kidney tests, and a complete blood test will be done.  Blood tests will be done to measure the pregnancy hormone levels and to determine your blood type.  If you are Rh-negative and the father is Rh-positive or his Rh type is not known, you will be given a Rho (D) immune globulin shot. What happens during the procedure? Your health care provider may give you methotrexate by injection or in the form of a pill. Methotrexate may be given as a single dose of medicine or a series of doses, depending on your response to the treatment.  Methotrexate injections will be given by your health care provider. This is the most common way that methotrexate is used  to treat an ectopic pregnancy.  If you are prescribed oral methotrexate, it is very important that you follow your health care provider's instructions on how to take oral methotrexate. Additional medicines may be needed to manage an ectopic pregnancy. The procedure may vary among health care providers and hospitals. What happens after the procedure?  You may have abdominal cramping, vaginal bleeding, and fatigue.  Blood tests will be taken at timed intervals for several days or weeks to check your pregnancy hormone levels. The blood tests will be done until the pregnancy hormone can no longer be detected in the blood.  You may need to have a surgical procedure to remove the ectopic pregnancy if methotrexate treatment fails.  Follow instructions from your health care provider on how and when to report any symptoms that may indicate a ruptured ectopic pregnancy. Summary  Methotrexate is a medicine that treats an ectopic pregnancy.  Methotrexate may be given in a single dose or a series of doses over time.  Blood tests will be taken at timed intervals for several days or weeks to check your pregnancy hormone levels. The blood tests will be done until no more pregnancy hormone is detected in the blood.  There is a risk that methotrexate treatment will fail and your pregnancy will continue. There is also a risk that the ectopic pregnancy might rupture while you are using this medicine. This information is not intended to replace advice given to you by your health care provider. Make sure you discuss any questions you have with your health care provider. Document Revised: 11/30/2016 Document Reviewed: 02/07/2016 Elsevier Patient   Education  [employer]. Ectopic Pregnancy  An ectopic pregnancy is when the fertilized egg attaches (implants) outside the uterus. Most ectopic pregnancies occur in one of the tubes where eggs travel from the ovary to the uterus (fallopian tubes), but the  implanting can occur in other locations. In rare cases, ectopic pregnancies occur on the ovary, intestine, pelvis, abdomen, or cervix. In an ectopic pregnancy, the fertilized egg does not have the ability to develop into a normal, healthy baby. A ruptured ectopic pregnancy is one in which tearing or bursting of a fallopian tube causes internal bleeding. Often, there is intense lower abdominal pain, and vaginal bleeding sometimes occurs. Having an ectopic pregnancy can be life-threatening. If this dangerous condition is not treated, it can lead to blood loss, shock, or even death. What are the causes? The most common cause of this condition is damage to one of the fallopian tubes. A fallopian tube may be narrowed or blocked, and that keeps the fertilized egg from reaching the uterus. What increases the risk? This condition is more likely to develop in women of childbearing age who have different levels of risk. The levels of risk can be divided into three categories. High risk  You have gone through infertility treatment.  You have had an ectopic pregnancy before.  You have had surgery on the fallopian tubes, or another surgical procedure, such as an abortion.  You have had surgery to have the fallopian tubes tied (tubal ligation).  You have problems or diseases of the fallopian tubes.  You have been exposed to diethylstilbestrol (DES). This medicine was used until 1971, and it had effects on babies whose mothers took the medicine.  You become pregnant while using an IUD (intrauterine device) for birth control. Moderate risk  You have a history of infertility.  You have had an STI (sexually transmitted infection).  You have a history of pelvic inflammatory disease (PID).  You have scarring from endometriosis.  You have multiple sexual partners.  You smoke. Low risk  You have had pelvic surgery.  You use vaginal douches.  You became sexually active before age 67. What are the  signs or symptoms? Common symptoms of this condition include normal pregnancy symptoms, such as missing a period, nausea, tiredness, abdominal pain, breast tenderness, and bleeding. However, ectopic pregnancy will have additional symptoms, such as:  Pain with intercourse.  Irregular vaginal bleeding or spotting.  Cramping or pain on one side or in the lower abdomen.  Fast heartbeat, low blood pressure, and sweating.  Passing out while having a bowel movement. Symptoms of a ruptured ectopic pregnancy and internal bleeding may include:  Sudden, severe pain in the abdomen and pelvis.  Dizziness, weakness, light-headedness, or fainting.  Pain in the shoulder or neck area. How is this diagnosed? This condition is diagnosed by:  A pelvic exam to locate pain or a mass in the abdomen.  A pregnancy test. This blood test checks for the presence as well as the specific level of pregnancy hormone in the bloodstream.  Ultrasound. This is performed if a pregnancy test is positive. In this test, a probe is inserted into the vagina. The probe will detect a fetus, possibly in a location other than the uterus.  Taking a sample of uterus tissue (dilation and curettage, or D&C).  Surgery to perform a visual exam of the inside of the abdomen using a thin, lighted tube that has a tiny camera on the end (laparoscope).  Culdocentesis. This procedure involves inserting  a needle at the top of the vagina, behind the uterus. If blood is present in this area, it may indicate that a fallopian tube is torn. How is this treated? This condition is treated with medicine or surgery. Medicine  An injection of a medicine (methotrexate) may be given to cause the pregnancy tissue to be absorbed. This medicine may save your fallopian tube. It may be given if: ? The diagnosis is made early, with no signs of active bleeding. ? The fallopian tube has not ruptured. ? You are considered to be a good candidate for the  medicine. Usually, pregnancy hormone blood levels are checked after methotrexate treatment. This is to be sure that the medicine is effective. It may take 4-6 weeks for the pregnancy to be absorbed. Most pregnancies will be absorbed by 3 weeks. Surgery  A laparoscope may be used to remove the pregnancy tissue.  If severe internal bleeding occurs, a larger cut (incision) may be made in the lower abdomen (laparotomy) to remove the fetus and placenta. This is done to stop the bleeding.  Part or all of the fallopian tube may be removed (salpingectomy) along with the fetus and placenta. The fallopian tube may also be repaired during the surgery.  In very rare circumstances, removal of the uterus (hysterectomy) may be required.  After surgery, pregnancy hormone testing may be done to be sure that there is no pregnancy tissue left. Whether your treatment is medicine or surgery, you may receive a Rho (D) immune globulin shot to prevent problems with any future pregnancy. This shot may be given if:  You are Rh-negative and the baby's father is Rh-positive.  You are Rh-negative and you do not know the Rh type of the baby's father. Follow these instructions at home:  Rest and limit your activity after the procedure for as long as told by your health care provider.  Until your health care provider says that it is safe: ? Do not lift anything that is heavier than 10 lb (4.5 kg), or the limit that your health care provider tells you. ? Avoid physical exercise and any movement that requires effort (is strenuous).  To help prevent constipation: ? Eat a healthy diet that includes fruits, vegetables, and whole grains. ? Drink 6-8 glasses of water per day. Get help right away if:  You develop worsening pain that is not relieved by medicine.  You have: ? A fever or chills. ? Vaginal bleeding. ? Redness and swelling at the incision site. ? Nausea and vomiting.  You feel dizzy or weak.  You feel  light-headed or you faint. This information is not intended to replace advice given to you by your health care provider. Make sure you discuss any questions you have with your health care provider. Document Revised: 11/30/2016 Document Reviewed: 07/20/2015 Elsevier Patient Education  [place].

## 2019-04-02 NOTE — Telephone Encounter (Signed)
Called pt after speaking with Dr Macon Large about her HCG results. Informed pt that she will need to go to MAU for a stat HCG to rule out ectopic pregnancy tomorrow unless she becomes symptomatic then she needs to go today or tonight. Pt verbalizes and understands. MAU provider made aware.

## 2019-04-03 ENCOUNTER — Inpatient Hospital Stay (HOSPITAL_COMMUNITY): Payer: 59

## 2019-04-03 ENCOUNTER — Encounter (HOSPITAL_COMMUNITY): Payer: Self-pay | Admitting: Obstetrics & Gynecology

## 2019-04-03 ENCOUNTER — Inpatient Hospital Stay (HOSPITAL_COMMUNITY)
Admission: AD | Admit: 2019-04-03 | Discharge: 2019-04-03 | Disposition: A | Discharge status: Home / Self Care | Payer: 59 | Source: Ambulatory Visit | Attending: Family Medicine | Admitting: Family Medicine

## 2019-04-03 ENCOUNTER — Other Ambulatory Visit: Payer: Self-pay

## 2019-04-03 DIAGNOSIS — O26891 Other specified pregnancy related conditions, first trimester: Secondary | ICD-10-CM | POA: Insufficient documentation

## 2019-04-03 DIAGNOSIS — R109 Unspecified abdominal pain: Secondary | ICD-10-CM

## 2019-04-03 DIAGNOSIS — O3680X0 Pregnancy with inconclusive fetal viability, not applicable or unspecified: Secondary | ICD-10-CM | POA: Diagnosis not present

## 2019-04-03 DIAGNOSIS — Z3A01 Less than 8 weeks gestation of pregnancy: Secondary | ICD-10-CM | POA: Diagnosis not present

## 2019-04-03 DIAGNOSIS — O26851 Spotting complicating pregnancy, first trimester: Secondary | ICD-10-CM | POA: Diagnosis not present

## 2019-04-03 DIAGNOSIS — R1031 Right lower quadrant pain: Secondary | ICD-10-CM | POA: Insufficient documentation

## 2019-04-03 LAB — HCG, QUANTITATIVE, PREGNANCY: hCG, Beta Chain, Quant, S: 939 m[IU]/mL — ABNORMAL HIGH (ref <5)

## 2019-04-03 NOTE — Discharge Instructions (Signed)
Return to MAU:  If you have heavier bleeding that soaks through more that 2 pads per hour for an hour or more  If you bleed so much that you feel like you might pass out or you do pass out  If you have significant abdominal pain that is not improved with Tylenol 1000 mg  If you develop a fever > 100.5

## 2019-04-03 NOTE — MAU Note (Signed)
Pt here for repeat blood work.  States feeling ok. Only sees blood when wipes.  Some mild cramping in RLQ.

## 2019-04-03 NOTE — MAU Provider Note (Signed)
History     CSN: [id number]  Arrival date and time: 04/03/19 0909   First Provider Initiated Contact with Patient 04/03/19 1044      Chief Complaint  Patient presents with  . Follow-up   HPI  Ms.  Brandi Higgins is a 29 y.o. year old G58P0000 female at 5.[redacted] weeks gestation by U/S who presents to MAU reporting here for repeat HCG, but now having a new onset of "mild" RLQ cramping, she is still having VB with wiping. She hasn't tried to take anything for pain.   Past Medical History:  Diagnosis Date  . Abnormal Pap smear of vagina   . Heart murmur   . Migraines   . Swelling     Past Surgical History:  Procedure Laterality Date  . SHOULDER SURGERY    . WISDOM TOOTH EXTRACTION    . WRIST SURGERY      Family History  Problem Relation Age of Onset  . Diabetes Maternal Grandmother   . Diabetes Paternal Grandmother   . Cancer Neg Hx   . Heart disease Neg Hx     Social History   Tobacco Use  . Smoking status: Never Smoker  . Smokeless tobacco: Never Used  Substance Use Topics  . Alcohol use: Not Currently    Comment: occas  . Drug use: No    Allergies: No Known Allergies  No medications prior to admission.    Review of Systems Physical Exam   Blood pressure 119/77, pulse 97, temperature 98.5 F (36.9 C), temperature source Oral, resp. rate 16, height 5\' 5"  (1.651 m), weight 97.1 kg, last menstrual period 02/02/2019, SpO2 99 %.  Physical Exam  Nursing note and vitals reviewed. Constitutional: She is oriented to person, place, and time. She appears well-developed and well-nourished.  HENT:  Head: Normocephalic and atraumatic.  Eyes: Pupils are equal, round, and reactive to light.  Cardiovascular: Normal rate.  Respiratory: Effort normal.  GI: Soft.  Musculoskeletal:        General: Normal range of motion.     Cervical back: Normal range of motion.  Neurological: She is alert and oriented to person, place, and time.  Skin: Skin is warm and dry.   Psychiatric: She has a normal mood and affect. Her behavior is normal. Judgment and thought content normal.    MAU Course  Procedures  MDM Pelvic Exam -- declined OB TVUS  US OB Transvaginal  Result Date: 04/03/2019 CLINICAL DATA:  RIGHT lower quadrant pain and spotting, pregnant, LMP 02/02/2019 EXAM: TRANSVAGINAL OB ULTRASOUND TECHNIQUE: Transvaginal ultrasound was performed for complete evaluation of the gestation as well as the maternal uterus, adnexal regions, and pelvic cul-de-sac. COMPARISON:  03/30/2019 FINDINGS: Intrauterine gestational sac: Visualized Yolk sac:  Not identified Embryo:  Not identified Cardiac Activity: N/A Heart Rate: N/A bpm MSD: 4.0 mm   5 w   0 d Subchorionic hemorrhage:  None visualize Maternal uterus/adnexae: Retroverted uterus. No uterine mass or additional focal abnormality. RIGHT ovary normal size and morphology 3.3 x 2.0 x 2.6 cm LEFT ovary normal size and morphology 2.1 x 1.5 x 1.8 cm. Small amount of free pelvic fluid. No adnexal masses. IMPRESSION: Small gestational sac identified within uterus, size little changed since 03/30/2019. No yolk sac or fetal pole identified to establish viability. Small amount of free pelvic fluid, unchanged. Follow-up ultrasound recommended in 14 days to determine viability. Electronically Signed   By: Lavonia Dana M.D.   On: 04/03/2019 10:21   *Consult with Dr. Kennon Rounds @  1050 - notified of patient's complaints, assessments, lab & U/S results, recommended tx plan offer repeat HCG in 48 hours, may consider MTX due pregnancy likely not being a normal pregnancy, allow patient to make choice of repeating HCG or MTX  Assessment and Plan  Pregnancy of unknown anatomic location - Repeat HCG in 48 hrs - Ectopic precautions   Abdominal pain during pregnancy in first trimester - Information provided on abdominal pain in pregnancy    - Discharge home - Return to MAU on Sunday 04/05/2019 - Patient verbalized an understanding of the plan of  care and agrees.   Raelyn Mora, MSN, CNM 04/03/2019, 11:25 AM

## 2019-04-05 ENCOUNTER — Other Ambulatory Visit: Payer: Self-pay

## 2019-04-05 ENCOUNTER — Inpatient Hospital Stay (HOSPITAL_COMMUNITY)
Admission: AD | Admit: 2019-04-05 | Discharge: 2019-04-05 | Disposition: A | Discharge status: Home / Self Care | Payer: 59 | Attending: Family Medicine | Admitting: Family Medicine

## 2019-04-05 DIAGNOSIS — O009 Unspecified ectopic pregnancy without intrauterine pregnancy: Secondary | ICD-10-CM | POA: Diagnosis not present

## 2019-04-05 DIAGNOSIS — Z3A08 8 weeks gestation of pregnancy: Secondary | ICD-10-CM | POA: Diagnosis not present

## 2019-04-05 LAB — CBC
HCT: 40.3 % (ref 36.0–46.0)
Hemoglobin: 13.4 g/dL (ref 12.0–15.0)
MCH: 29.9 pg (ref 26.0–34.0)
MCHC: 33.3 g/dL (ref 30.0–36.0)
MCV: 90 fL (ref 80.0–100.0)
Platelets: 273 10*3/uL (ref 150–400)
RBC: 4.48 MIL/uL (ref 3.87–5.11)
RDW: 12.5 % (ref 11.5–15.5)
WBC: 7.9 10*3/uL (ref 4.0–10.5)
nRBC: 0 % (ref 0.0–0.2)

## 2019-04-05 LAB — COMPREHENSIVE METABOLIC PANEL
ALT: 14 U/L (ref 0–44)
AST: 16 U/L (ref 15–41)
Albumin: 3.9 g/dL (ref 3.5–5.0)
Alkaline Phosphatase: 57 U/L (ref 38–126)
Anion gap: 9 (ref 5–15)
BUN: 10 mg/dL (ref 6–20)
CO2: 24 mmol/L (ref 22–32)
Calcium: 9 mg/dL (ref 8.9–10.3)
Chloride: 105 mmol/L (ref 98–111)
Creatinine, Ser: 0.81 mg/dL (ref 0.44–1.00)
GFR calc Af Amer: >60 mL/min (ref >60)
GFR calc non Af Amer: >60 mL/min (ref >60)
Glucose, Bld: 108 mg/dL — ABNORMAL HIGH (ref 70–99)
Potassium: 3.8 mmol/L (ref 3.5–5.1)
Sodium: 138 mmol/L (ref 135–145)
Total Bilirubin: 1 mg/dL (ref 0.3–1.2)
Total Protein: 7.3 g/dL (ref 6.5–8.1)

## 2019-04-05 LAB — HCG, QUANTITATIVE, PREGNANCY: hCG, Beta Chain, Quant, S: 1382 m[IU]/mL — ABNORMAL HIGH (ref <5)

## 2019-04-05 MED ORDER — METHOTREXATE FOR ECTOPIC PREGNANCY
50.0000 mg/m2 | Freq: Once | INTRAMUSCULAR | Status: AC
  Administered 2019-04-05: 105 mg via INTRAMUSCULAR
  Filled 2019-04-05: qty 1

## 2019-04-05 NOTE — MAU Note (Signed)
Informed pt and support person that labs are back and [place] CNM is aware.

## 2019-04-05 NOTE — MAU Note (Signed)
Brandi Higgins is a 29 y.o. here in MAU reporting: here for follow up hcg. Denies pain and bleeding.   Pain score: 0/10  Vitals:   04/05/19 0910  BP: 122/80  Pulse: (!) 103  Resp: 16  Temp: 98.8 F (37.1 C)  SpO2: 100%      Lab orders placed from triage: ordered by provider

## 2019-04-05 NOTE — MAU Provider Note (Signed)
History   First Provider Initiated Contact with Patient 04/05/2019 at 1055.   Chief Complaint:  Follow-up   Brandi Higgins is  29 y.o. G1P0000 Patient's last menstrual period was 02/02/2019 (exact date).. Patient is here for follow up of quantitative HCG and ongoing surveillance of pregnancy status.   She is [redacted]w[redacted]d weeks gestation  by LMP.    Since her last visit, the patient is without new complaint.     ROS Abdominal Pain: Denies Vaginal bleeding: none now.   Passage of clots or tissue: Denies Dizziness: Denies  A POS Performed at [facility], [address] [place], [place] [address]   Her previous Quantitative HCG values are: Results for Brandi Higgins (MRN [id number]) as of 04/05/2019 09:45  Ref. Range 03/30/19 04/01/2019 08:38 04/03/2019 09:25  hCG Quant Latest Units: mIU/mL 607 589   HCG, Beta Chain, Quant, S Latest Ref Range: <5 mIU/mL   939 (H)    Physical Exam   Patient Vitals for the past 24 hrs:  BP Temp Temp src Pulse Resp SpO2 Height Weight  04/05/19 0910 122/80 98.8 F (37.1 C) Oral (!) 103 16 100 % -- --  04/05/19 0907 -- -- -- -- -- -- 5\' 5"  (1.651 m) 96.4 kg   Constitutional: Well-nourished female in no apparent distress. No pallor Neuro: Alert and oriented 4 Cardiovascular: Normal rate Respiratory: Normal effort and rate Abdomen: Soft, nontender Gynecological Exam: examination not indicated  Labs: Results for orders placed or performed during the hospital encounter of 04/05/19 (from the past 24 hour(s))  Comprehensive metabolic panel   Collection Time: 04/05/19  9:34 AM  Result Value Ref Range   Sodium 138 135 - 145 mmol/L   Potassium 3.8 3.5 - 5.1 mmol/L   Chloride 105 98 - 111 mmol/L   CO2 24 22 - 32 mmol/L   Glucose, Bld 108 (H) 70 - 99 mg/dL   BUN 10 6 - 20 mg/dL   Creatinine, Ser 06/05/19 0.44 - 1.00 mg/dL   Calcium 9.0 8.9 - 5.39 mg/dL   Total Protein 7.3 6.5 - 8.1 g/dL   Albumin 3.9 3.5 - 5.0 g/dL   AST 16 15 - 41 U/L   ALT 14  0 - 44 U/L   Alkaline Phosphatase 57 38 - 126 U/L   Total Bilirubin 1.0 0.3 - 1.2 mg/dL   GFR calc non Af Amer >60 >60 mL/min   GFR calc Af Amer >60 >60 mL/min   Anion gap 9 5 - 15  CBC   Collection Time: 04/05/19  9:34 AM  Result Value Ref Range   WBC 7.9 4.0 - 10.5 K/uL   RBC 4.48 3.87 - 5.11 MIL/uL   Hemoglobin 13.4 12.0 - 15.0 g/dL   HCT 06/05/19 34.1 - 93.7 %   MCV 90.0 80.0 - 100.0 fL   MCH 29.9 26.0 - 34.0 pg   MCHC 33.3 30.0 - 36.0 g/dL   RDW 90.2 40.9 - 73.5 %   Platelets 273 150 - 400 K/uL   nRBC 0.0 0.0 - 0.2 %  hCG, quantitative, pregnancy   Collection Time: 04/05/19  9:34 AM  Result Value Ref Range   hCG, Beta Chain, Quant, S 1,382 (H) <5 mIU/mL    Ultrasound Studies:   06/05/19 OB Transvaginal  Result Date: 04/03/2019 CLINICAL DATA:  RIGHT lower quadrant pain and spotting, pregnant, LMP 02/02/2019 EXAM: TRANSVAGINAL OB ULTRASOUND TECHNIQUE: Transvaginal ultrasound was performed for complete evaluation of the gestation as well as the maternal  uterus, adnexal regions, and pelvic cul-de-sac. COMPARISON:  03/30/2019 FINDINGS: Intrauterine gestational sac: Visualized Yolk sac:  Not identified Embryo:  Not identified Cardiac Activity: N/A Heart Rate: N/A bpm MSD: 4.0 mm   5 w   0 d Subchorionic hemorrhage:  None visualize Maternal uterus/adnexae: Retroverted uterus. No uterine mass or additional focal abnormality. RIGHT ovary normal size and morphology 3.3 x 2.0 x 2.6 cm LEFT ovary normal size and morphology 2.1 x 1.5 x 1.8 cm. Small amount of free pelvic fluid. No adnexal masses. IMPRESSION: Small gestational sac identified within uterus, size little changed since 03/30/2019. No yolk sac or fetal pole identified to establish viability. Small amount of free pelvic fluid, unchanged. Follow-up ultrasound recommended in 14 days to determine viability. Electronically Signed   By: Ulyses Southward M.D.   On: 04/03/2019 10:21   US OB LESS THAN 14 WEEKS WITH OB TRANSVAGINAL  Result Date:  03/30/2019 CLINICAL DATA:  Vaginal bleeding with positive beta HCG examination. EXAM: OBSTETRIC <14 WK [place] AND TRANSVAGINAL OB US TECHNIQUE: Both transabdominal and transvaginal ultrasound examinations were performed for complete evaluation of the gestation as well as the maternal uterus, adnexal regions, and pelvic cul-de-sac. Transvaginal technique was performed to assess early pregnancy. COMPARISON:  None. FINDINGS: Intrauterine gestational sac: Visualized Yolk sac:  Not visualized Embryo:  Not visualized Cardiac Activity: Not visualized MSD: 3 mm   5 w   0 d Subchorionic hemorrhage:  None visualized. Maternal uterus/adnexae: Cervical os is closed. Left ovary measures 2.9 x 1.2 x 2.1 cm. Right ovary measures 3.2 x 2.3 x 3.1 cm. Small corpus luteum on the right. No other extrauterine pelvic mass. Small amount of free pelvic fluid noted toward the left. IMPRESSION: Probable early intrauterine gestational sac, but no yolk sac, fetal pole, or cardiac activity yet visualized. Recommend follow-up quantitative B-HCG levels and follow-up US in 14 days to assess viability. This recommendation follows SRU consensus guidelines: Diagnostic Criteria for Nonviable Pregnancy Early in the First Trimester. Malva Limes Med 2013; 010:2725-36. based on gestational sac size, estimated gestational age is approximately 5 weeks. No subchorionic hemorrhage evident. Small amount of fluid in the leftward pelvis may indicate recent cyst rupture. Electronically Signed   By: Bretta Bang III M.D.   On: 03/30/2019 10:15    MAU course/MDM: Orders Placed This Encounter  Procedures  . Comprehensive metabolic panel  . CBC  . hCG, quantitative, pregnancy  . Beta hCG quant (ref lab)  . Discharge patient   3/31: 3% drop 04/03/19: 59% rise 04/05/19: 47% rise  Pain and bleeding in early pregnancy with abnormal rise in Quant, but hemodynamically stable. Discussed Hx, labs, exam w/ Dr. Shawnie Pons. Recommends shared decision making w/ pt. Unlikely  that this is a nml pregnancy w/ additional abnormal rise in quant, but cannot exclude possible nml pregnancy. Discussed F/U quant and [place] in 48 hours for confirmation vs MTX for likely ectopic pregnancy. Pt very nervous about failing to Tx possible ectopic. Risks and benefits reviewed. Would like to proceed with methotrexate.  Emphasized importance of follow-up hCGs to be sure that it is working.  Informed that although methotrexate is highly effective there is still a small possibility of ruptured ectopic and instructed patient to return to maternity admissions immediately for severe abdominal pain.  Day #4 hCG due Wednesday, 04/08/2019 Day #7 hCG due Saturday, 04/11/2019  Assessment: 1. Ectopic pregnancy without intrauterine pregnancy, unspecified location    Plan: Discharge home in stable condition. Support given Ectopic precautions Follow-up Information  [facility] Follow up on 04/08/2019.   Specialty: Obstetrics and Gynecology Why: For repeat blood work. Contact information: [facility] Follow up on 04/11/2019.   Specialty: Obstetrics and Gynecology Why: For repeat blood work Contact information: [address] [id number] [place] [phone]         Allergies as of 04/05/2019   No Known Allergies     Medication List    You have not been prescribed any medications.     Tamala Julian, [place], [place] 04/05/2019, 12:28 PM  2/3

## 2019-04-05 NOTE — Discharge Instructions (Signed)
Methotrexate Treatment for an Ectopic Pregnancy  Methotrexate is a medicine that treats an ectopic pregnancy. An ectopic pregnancy is a pregnancy in which the fetus develops outside the uterus. This kind of pregnancy can be dangerous. Methotrexate works by stopping the growth of the fertilized egg. It also helps your body absorb tissue from the egg. This takes between 2-6 weeks. Most ectopic pregnancies can be successfully treated with methotrexate if they are diagnosed early. Tell a health care provider about:  Any allergies you have.  All medicines you are taking, including vitamins, herbs, eye drops, creams, and over-the-counter medicines.  Any medical conditions you have. What are the risks? Generally, this is a safe treatment. However, problems may occur, including:  Nausea or vomiting or both.  Vaginal bleeding or spotting.  Diarrhea.  Abdominal cramping.  Dizziness or feeling lightheaded.  Mouth sores.  Swelling or irritation of the lining of your lungs (pneumonitis).  Liver damage.  Hair loss. There is a risk that methotrexate treatment will fail and your pregnancy will continue. There is also a risk that the ectopic pregnancy might rupture while you are using this medicine. What happens before the procedure?  Liver tests, kidney tests, and a complete blood test will be done.  Blood tests will be done to measure the pregnancy hormone levels and to determine your blood type.  If you are Rh-negative and the father is Rh-positive or his Rh type is not known, you will be given a Rho (D) immune globulin shot. What happens during the procedure? Your health care provider may give you methotrexate by injection or in the form of a pill. Methotrexate may be given as a single dose of medicine or a series of doses, depending on your response to the treatment.  Methotrexate injections will be given by your health care provider. This is the most common way that methotrexate is  used to treat an ectopic pregnancy.  If you are prescribed oral methotrexate, it is very important that you follow your health care provider's instructions on how to take oral methotrexate. Additional medicines may be needed to manage an ectopic pregnancy. The procedure may vary among health care providers and hospitals. What happens after the procedure?  You may have abdominal cramping, vaginal bleeding, and fatigue.  Blood tests will be taken at timed intervals for several days or weeks to check your pregnancy hormone levels. The blood tests will be done until the pregnancy hormone can no longer be detected in the blood.  You may need to have a surgical procedure to remove the ectopic pregnancy if methotrexate treatment fails.  Follow instructions from your health care provider on how and when to report any symptoms that may indicate a ruptured ectopic pregnancy. Summary  Methotrexate is a medicine that treats an ectopic pregnancy.  Methotrexate may be given in a single dose or a series of doses over time.  Blood tests will be taken at timed intervals for several days or weeks to check your pregnancy hormone levels. The blood tests will be done until no more pregnancy hormone is detected in the blood.  There is a risk that methotrexate treatment will fail and your pregnancy will continue. There is also a risk that the ectopic pregnancy might rupture while you are using this medicine. This information is not intended to replace advice given to you by your health care provider. Make sure you discuss any questions you have with your health care provider. Document Revised: 11/30/2016 Document Reviewed: 02/07/2016 Elsevier  Patient Education  [employer].   Methotrexate Treatment for an Ectopic Pregnancy, Care After This sheet gives you information about how to care for yourself after your procedure. Your health care provider may also give you more specific instructions. If you have  problems or questions, contact your health care provider. What can I expect after the procedure? After the procedure, it is common to have:  Abdominal cramping.  Vaginal bleeding.  Fatigue.  Nausea.  Vomiting.  Diarrhea. Blood tests will be taken at timed intervals for several days or weeks to check your pregnancy hormone levels. The blood tests will be done until the pregnancy hormone can no longer be detected in the blood. Follow these instructions at home: Activity  Do not have sex until your health care provider approves.  Limit activities that take a lot of effort as told by your health care provider. Medicines  Take over the counter and prescription medicines only as told by your health care provider.  Do not take aspirin, ibuprofen, naproxen, or any other NSAIDs.  Do not take folic acid, prenatal vitamins, or other vitamins that contain folic acid. General instructions   Do not drink alcohol.  Follow instructions from your health care provider on how and when to report any symptoms that may indicate a ruptured ectopic pregnancy.  Keep all follow-up visits as told by your health care provider. This is important. Contact a health care provider if:  You have persistent nausea and vomiting.  You have persistent diarrhea.  You are having a reaction to the medicine, such as: ? Tiredness. ? Skin rash. ? Hair loss. Get help right away if:  Your abdominal or pelvic pain gets worse.  You have more vaginal bleeding.  You feel light-headed or you faint.  You have shortness of breath.  Your heart rate increases.  You develop a cough.  You have chills.  You have a fever. Summary  After the procedure, it is common to have symptoms of abdominal cramping, vaginal bleeding and fatigue. You may also experience other symptoms.  Blood tests will be taken at timed intervals for several days or weeks to check your pregnancy hormone levels. The blood tests will be  done until the pregnancy hormone can no longer be detected in the blood.  Limit strenuous activity as told by your health care provider.  Follow instructions from your health care provider on how and when to report any symptoms that may indicate a ruptured ectopic pregnancy. This information is not intended to replace advice given to you by your health care provider. Make sure you discuss any questions you have with your health care provider. Document Revised: 11/30/2016 Document Reviewed: 02/07/2016 Elsevier Patient Education  2020 [employer].

## 2019-04-08 ENCOUNTER — Other Ambulatory Visit: Payer: Self-pay

## 2019-04-08 ENCOUNTER — Other Ambulatory Visit: Payer: 59

## 2019-04-08 DIAGNOSIS — O009 Unspecified ectopic pregnancy without intrauterine pregnancy: Secondary | ICD-10-CM

## 2019-04-08 LAB — BETA HCG QUANT (REF LAB): hCG Quant: 1300 m[IU]/mL

## 2019-04-11 ENCOUNTER — Inpatient Hospital Stay (HOSPITAL_COMMUNITY)
Admission: AD | Admit: 2019-04-11 | Discharge: 2019-04-11 | Disposition: A | Discharge status: Home / Self Care | Payer: 59 | Attending: Obstetrics and Gynecology | Admitting: Obstetrics and Gynecology

## 2019-04-11 ENCOUNTER — Inpatient Hospital Stay (HOSPITAL_COMMUNITY): Payer: 59

## 2019-04-11 ENCOUNTER — Other Ambulatory Visit: Payer: Self-pay

## 2019-04-11 DIAGNOSIS — O3680X0 Pregnancy with inconclusive fetal viability, not applicable or unspecified: Secondary | ICD-10-CM

## 2019-04-11 DIAGNOSIS — Z3A09 9 weeks gestation of pregnancy: Secondary | ICD-10-CM | POA: Insufficient documentation

## 2019-04-11 DIAGNOSIS — O209 Hemorrhage in early pregnancy, unspecified: Secondary | ICD-10-CM | POA: Insufficient documentation

## 2019-04-11 DIAGNOSIS — Z3A01 Less than 8 weeks gestation of pregnancy: Secondary | ICD-10-CM | POA: Diagnosis not present

## 2019-04-11 LAB — COMPREHENSIVE METABOLIC PANEL
ALT: 16 U/L (ref 0–44)
AST: 17 U/L (ref 15–41)
Albumin: 4 g/dL (ref 3.5–5.0)
Alkaline Phosphatase: 52 U/L (ref 38–126)
Anion gap: 9 (ref 5–15)
BUN: 11 mg/dL (ref 6–20)
CO2: 22 mmol/L (ref 22–32)
Calcium: 9 mg/dL (ref 8.9–10.3)
Chloride: 106 mmol/L (ref 98–111)
Creatinine, Ser: 0.82 mg/dL (ref 0.44–1.00)
GFR calc Af Amer: >60 mL/min (ref >60)
GFR calc non Af Amer: >60 mL/min (ref >60)
Glucose, Bld: 109 mg/dL — ABNORMAL HIGH (ref 70–99)
Potassium: 4.1 mmol/L (ref 3.5–5.1)
Sodium: 137 mmol/L (ref 135–145)
Total Bilirubin: 1 mg/dL (ref 0.3–1.2)
Total Protein: 6.9 g/dL (ref 6.5–8.1)

## 2019-04-11 LAB — CBC WITH DIFFERENTIAL/PLATELET
Abs Immature Granulocytes: 0.02 10*3/uL (ref 0.00–0.07)
Basophils Absolute: 0 10*3/uL (ref 0.0–0.1)
Basophils Relative: 0 %
Eosinophils Absolute: 0.1 10*3/uL (ref 0.0–0.5)
Eosinophils Relative: 2 %
HCT: 39.9 % (ref 36.0–46.0)
Hemoglobin: 13 g/dL (ref 12.0–15.0)
Immature Granulocytes: 0 %
Lymphocytes Relative: 22 %
Lymphs Abs: 1.6 10*3/uL (ref 0.7–4.0)
MCH: 29.5 pg (ref 26.0–34.0)
MCHC: 32.6 g/dL (ref 30.0–36.0)
MCV: 90.5 fL (ref 80.0–100.0)
Monocytes Absolute: 0.5 10*3/uL (ref 0.1–1.0)
Monocytes Relative: 7 %
Neutro Abs: 5 10*3/uL (ref 1.7–7.7)
Neutrophils Relative %: 69 %
Platelets: 282 10*3/uL (ref 150–400)
RBC: 4.41 MIL/uL (ref 3.87–5.11)
RDW: 12.8 % (ref 11.5–15.5)
WBC: 7.4 10*3/uL (ref 4.0–10.5)
nRBC: 0 % (ref 0.0–0.2)

## 2019-04-11 LAB — HCG, QUANTITATIVE, PREGNANCY: hCG, Beta Chain, Quant, S: 2127 m[IU]/mL — ABNORMAL HIGH (ref <5)

## 2019-04-11 MED ORDER — METHOTREXATE FOR ECTOPIC PREGNANCY
50.0000 mg/m2 | Freq: Once | INTRAMUSCULAR | Status: AC
  Administered 2019-04-11: 105 mg via INTRAMUSCULAR
  Filled 2019-04-11: qty 1

## 2019-04-11 NOTE — Discharge Instructions (Signed)
Methotrexate injection What is this medicine? METHOTREXATE (METH oh TREX ate) is a chemotherapy drug used to treat cancer including breast cancer, leukemia, and lymphoma. This medicine can also be used to treat psoriasis and certain kinds of arthritis. This medicine may be used for other purposes; ask your health care provider or pharmacist if you have questions. What should I tell my health care provider before I take this medicine? They need to know if you have any of these conditions:  fluid in the stomach area or lungs  if you often drink alcohol  infection or immune system problems  kidney disease  liver disease  low blood counts, like low white cell, platelet, or red cell counts  lung disease  radiation therapy  stomach ulcers  ulcerative colitis  an unusual or allergic reaction to methotrexate, other medicines, foods, dyes, or preservatives  pregnant or trying to get pregnant  breast-feeding How should I use this medicine? This medicine is for infusion into a vein or for injection into muscle or into the spinal fluid (whichever applies). It is usually given by a health care professional in a hospital or clinic setting. In rare cases, you might get this medicine at home. You will be taught how to give this medicine. Use exactly as directed. Take your medicine at regular intervals. Do not take your medicine more often than directed. If this medicine is used for arthritis or psoriasis, it should be taken weekly, NOT daily. It is important that you put your used needles and syringes in a special sharps container. Do not put them in a trash can. If you do not have a sharps container, call your pharmacist or healthcare provider to get one. Talk to your pediatrician regarding the use of this medicine in children. While this drug may be prescribed for children as young as 2 years for selected conditions, precautions do apply. Overdosage: If you think you have taken too much of  this medicine contact a poison control center or emergency room at once. NOTE: This medicine is only for you. Do not share this medicine with others. What if I miss a dose? It is important not to miss your dose. Call your doctor or health care professional if you are unable to keep an appointment. If you give yourself the medicine and you miss a dose, talk with your doctor or health care professional. Do not take double or extra doses. What may interact with this medicine? This medicine may interact with the following medications:  acitretin  aspirin or aspirin-like medicines including salicylates  azathioprine  certain antibiotics like chloramphenicol, penicillin, tetracycline  certain medicines for stomach problems like esomeprazole, omeprazole, pantoprazole  cyclosporine  gold  hydroxychloroquine  live virus vaccines  mercaptopurine  NSAIDs, medicines for pain and inflammation, like ibuprofen or naproxen  other cytotoxic agents  penicillamine  phenylbutazone  phenytoin  probenacid  retinoids such as isotretinoin and tretinoin  steroid medicines like prednisone or cortisone  sulfonamides like sulfasalazine and trimethoprim/sulfamethoxazole  theophylline This list may not describe all possible interactions. Give your health care provider a list of all the medicines, herbs, non-prescription drugs, or dietary supplements you use. Also tell them if you smoke, drink alcohol, or use illegal drugs. Some items may interact with your medicine. What should I watch for while using this medicine? Avoid alcoholic drinks. In some cases, you may be given additional medicines to help with side effects. Follow all directions for their use. This medicine can make you more sensitive to   the sun. Keep out of the sun. If you cannot avoid being in the sun, wear protective clothing and use sunscreen. Do not use sun lamps or tanning beds/booths. You may get drowsy or dizzy. Do not drive,  use machinery, or do anything that needs mental alertness until you know how this medicine affects you. Do not stand or sit up quickly, especially if you are an older patient. This reduces the risk of dizzy or fainting spells. You may need blood work done while you are taking this medicine. Call your doctor or health care professional for advice if you get a fever, chills or sore throat, or other symptoms of a cold or flu. Do not treat yourself. This drug decreases your body's ability to fight infections. Try to avoid being around people who are sick. This medicine may increase your risk to bruise or bleed. Call your doctor or health care professional if you notice any unusual bleeding. Check with your doctor or health care professional if you get an attack of severe diarrhea, nausea and vomiting, or if you sweat a lot. The loss of too much body fluid can make it dangerous for you to take this medicine. Talk to your doctor about your risk of cancer. You may be more at risk for certain types of cancers if you take this medicine. Both men and women must use effective birth control with this medicine. Do not become pregnant while taking this medicine or until at least 1 normal menstrual cycle has occurred after stopping it. Women should inform their doctor if they wish to become pregnant or think they might be pregnant. Men should not father a child while taking this medicine and for 3 months after stopping it. There is a potential for serious side effects to an unborn child. Talk to your health care professional or pharmacist for more information. Do not breast-feed an infant while taking this medicine. What side effects may I notice from receiving this medicine? Side effects that you should report to your doctor or health care professional as soon as possible:  allergic reactions like skin rash, itching or hives, swelling of the face, lips, or tongue  back pain  breathing problems or shortness of  breath  confusion  diarrhea  dry, nonproductive cough  low blood counts - this medicine may decrease the number of white blood cells, red blood cells and platelets. You may be at increased risk of infections and bleeding  mouth sores  redness, blistering, peeling or loosening of the skin, including inside the mouth  seizures  severe headaches  signs of infection - fever or chills, cough, sore throat, pain or difficulty passing urine  signs and symptoms of bleeding such as bloody or black, tarry stools; red or dark-brown urine; spitting up blood or brown material that looks like coffee grounds; red spots on the skin; unusual bruising or bleeding from the eye, gums, or nose  signs and symptoms of kidney injury like trouble passing urine or change in the amount of urine  signs and symptoms of liver injury like dark yellow or brown urine; general ill feeling or flu-like symptoms; light-colored stools; loss of appetite; nausea; right upper belly pain; unusually weak or tired; yellowing of the eyes or skin  stiff neck  vomiting Side effects that usually do not require medical attention (report to your doctor or health care professional if they continue or are bothersome):  dizziness  hair loss  headache  stomach pain  upset stomach This list   may not describe all possible side effects. Call your doctor for medical advice about side effects. You may report side effects to FDA at 1-800-FDA-1088. Where should I keep my medicine? If you are using this medicine at home, you will be instructed on how to store this medicine. Throw away any unused medicine after the expiration date on the label. NOTE: This sheet is a summary. It may not cover all possible information. If you have questions about this medicine, talk to your doctor, pharmacist, or health care provider.  2020 Elsevier/Gold Standard (2016-08-09 13:31:42)   

## 2019-04-11 NOTE — MAU Provider Note (Addendum)
History   Chief Complaint:  Follow-up   Brandi Higgins is  29 y.o. G1P0000 Patient's last menstrual period was 02/02/2019 (exact date).. Patient is here for follow up of quantitative HCG and ongoing surveillance of pregnancy status. She is [redacted]w[redacted]d weeks gestation  by LMP.    Since her last visit, the patient is with new complaint of spotting when she wipes. The patient reports bleeding as  spotting.  She denies any pain.  General ROS:  positive for vaginal bleeding  Her previous Quantitative HCG values are:  Results for Brandi, Higgins (MRN [id number]) as of 04/11/2019 12:14  Ref. Range 04/01/2019 08:38 04/03/2019 09:25 04/05/2019 09:34 04/08/2019 08:26  hCG Quant Latest Units: mIU/mL 589   1,300  HCG, Beta Chain, Quant, S Latest Ref Range: <5 mIU/mL  939 (H) 1,382 (H)    Physical Exam   Blood pressure 117/81, pulse 97, temperature 98.4 F (36.9 C), temperature source Oral, resp. rate 16, height 5\' 5"  (1.651 m), weight 96.3 kg, last menstrual period 02/02/2019, SpO2 99 %.  Focused Gynecological Exam: examination not indicated  Labs: Results for orders placed or performed during the hospital encounter of 04/11/19 (from the past 24 hour(s))  hCG, quantitative, pregnancy   Collection Time: 04/11/19  8:24 AM  Result Value Ref Range   hCG, Beta Chain, Quant, S 2,127 (H) <5 mIU/mL    Ultrasound Studies:   04-14-1971 OB Transvaginal  Result Date: 04/11/2019 CLINICAL DATA:  Vaginal bleeding in early pregnancy. EXAM: TRANSVAGINAL OB ULTRASOUND TECHNIQUE: Transvaginal ultrasound was performed for complete evaluation of the gestation as well as the maternal uterus, adnexal regions, and pelvic cul-de-sac. COMPARISON:  04/03/2019 FINDINGS: Intrauterine gestational sac: Cystic area with surrounding decidual reaction as before. Potentially gestational sac based on appearance but without yolk sac or embryo. Yolk sac:  Not visualized Embryo:  Not visualized MSD: 6.5 mm   5 w   2 d Subchorionic hemorrhage:  None  visualized. Maternal uterus/adnexae: Normal appearance of the uterus and adnexa otherwise. With small volume fluid in the cul-de-sac with similar appearance. IMPRESSION: Potential intrauterine gestational sac at the uterine fundus shows little if any change over 8 days and does not display evidence of embryo or yolk sac. Continued follow-up is suggested to ensure confirm pregnancy location. Serial beta HCG and endovaginal sonography may be helpful in 7-10 days or as warranted. Electronically Signed   By: 9-10 M.D.   On: 04/11/2019 11:38   Consulted with Dr. 06/11/2019 regarding HCG and ultrasound results. MD recommends second dose of MTX in MAU today and to restart HCG monitoring.   Reviewed plan of care with patient and patient agreeable to plan of care.   CBC with Diff CMP MTX  Assessment:   1. Pregnancy of unknown anatomic location      Plan: -Discharge home in stable condition -Strict ectopic precautions discussed -Patient advised to follow-up with [facility] on 4/13 and 4/16 for day 4 and day 7 labs. Discussed importance of keeping appointments and returning to MAU for labs if unable to go to the office. Appointments scheduled in Epic and Urgent message sent to [facility].   -Patient may return to MAU as needed or if her condition were to change or worsen  [facility], CNM 04/11/2019, 10:22 AM

## 2019-04-11 NOTE — MAU Note (Signed)
Pt left before dc papers given or vs taken.

## 2019-04-11 NOTE — MAU Note (Signed)
Brandi Higgins is a 29 y.o. at [redacted]w[redacted]d here in MAU reporting: here for day 7 labs post MTX. No pain. Reports yesterday she started having light bleeding, only sees it when she wipes.  Pain score: 0/10  Vitals:   04/11/19 0812  BP: 117/81  Pulse: 97  Resp: 16  Temp: 98.4 F (36.9 C)  SpO2: 99%     Lab orders placed from triage: hcg ordered by provider

## 2019-04-14 ENCOUNTER — Other Ambulatory Visit: Payer: Self-pay

## 2019-04-14 ENCOUNTER — Telehealth: Payer: Self-pay | Admitting: *Deleted

## 2019-04-14 ENCOUNTER — Other Ambulatory Visit: Payer: 59

## 2019-04-14 ENCOUNTER — Other Ambulatory Visit: Payer: Self-pay | Admitting: *Deleted

## 2019-04-14 DIAGNOSIS — O009 Unspecified ectopic pregnancy without intrauterine pregnancy: Secondary | ICD-10-CM

## 2019-04-14 DIAGNOSIS — O3680X0 Pregnancy with inconclusive fetal viability, not applicable or unspecified: Secondary | ICD-10-CM

## 2019-04-14 LAB — BETA HCG QUANT (REF LAB): hCG Quant: 1241 m[IU]/mL

## 2019-04-14 NOTE — Telephone Encounter (Signed)
After Dr Shawnie Pons reviewed stat HCG results, pt was informed and scheduled to have another stat HCG drawn on Friday 4/16, at a [facility] location, order will be faxed with attending providers number to call results too.

## 2019-04-15 ENCOUNTER — Encounter: Payer: BLUE CROSS/BLUE SHIELD | Admitting: Advanced Practice Midwife

## 2019-04-17 ENCOUNTER — Other Ambulatory Visit: Payer: 59

## 2019-04-17 ENCOUNTER — Telehealth: Payer: Self-pay | Admitting: Obstetrics & Gynecology

## 2019-04-17 DIAGNOSIS — O009 Unspecified ectopic pregnancy without intrauterine pregnancy: Secondary | ICD-10-CM | POA: Diagnosis not present

## 2019-04-17 LAB — BETA HCG QUANT (REF LAB): hCG Quant: 62 m[IU]/mL

## 2019-04-17 NOTE — Telephone Encounter (Signed)
Pt's stat beta returned = 62.  Called patient with results and c/w miscarriage.  Pt still having some light bleeding.  Pt will make appt in 2 weeks at [facility] to discuss plans for conception.

## 2019-04-23 ENCOUNTER — Other Ambulatory Visit: Payer: Self-pay

## 2019-04-23 ENCOUNTER — Other Ambulatory Visit: Payer: 59

## 2019-04-23 DIAGNOSIS — O009 Unspecified ectopic pregnancy without intrauterine pregnancy: Secondary | ICD-10-CM

## 2019-04-24 LAB — BETA HCG QUANT (REF LAB): hCG Quant: 5 m[IU]/mL

## 2019-05-05 ENCOUNTER — Ambulatory Visit: Payer: 59 | Admitting: Obstetrics and Gynecology

## 2019-05-06 ENCOUNTER — Other Ambulatory Visit: Payer: Self-pay

## 2019-05-06 ENCOUNTER — Ambulatory Visit (INDEPENDENT_AMBULATORY_CARE_PROVIDER_SITE_OTHER): Payer: 59 | Admitting: Family Medicine

## 2019-05-06 ENCOUNTER — Encounter: Payer: Self-pay | Admitting: *Deleted

## 2019-05-06 ENCOUNTER — Encounter: Payer: 59 | Admitting: Family Medicine

## 2019-05-06 VITALS — BP 119/78 | HR 71 | Wt 212.8 lb

## 2019-05-06 DIAGNOSIS — O009 Unspecified ectopic pregnancy without intrauterine pregnancy: Secondary | ICD-10-CM | POA: Diagnosis not present

## 2019-05-06 NOTE — Progress Notes (Signed)
Ectopic/Failed pregnancy follow up  Subjective:   Brandi Higgins is a 29 y.o. [phone] female here for miscarriage follow up. Denies continued bleeding/ cramping. Reports emotionally she is raw but coping ok. She continues to have some crying episodes.    The following portions of the patient's history were reviewed and updated as appropriate: allergies, current medications, past family history, past medical history, past social history, past surgical history and problem list.  Review of Systems Pertinent items are noted in HPI.   Objective:  BP 119/78   Pulse 71   Wt 212 lb 12.8 oz (96.5 kg)   BMI 35.41 kg/m  Gen: well appearing, NAD HEENT: no scleral icterus CV: RR Lung: Normal WOB Ext: warm well perfused  MXT on 4/2  Lab Results  Component Value Date   HCGQUANT 5 04/23/2019   HCGQUANT 62 04/17/2019   HCGQUANT 1,241 04/14/2019   HCGQUANT 1,300 04/08/2019   HCGQUANT 589 04/01/2019     Assessment and Plan:  1. Ectopic pregnancy- s/p MXT on 4/4 with appropriate decrease in bHCG to now 5 - reviewed preconception health  - this pregnancy took about 55months to achieve. She reports cycles are 35-40 days typically - reviewed contraception and basic fertility. Patient desires pregnancy ASAP and will use no method, condoms  - Recommended waiting to officially try until she had one menses.  - If she has not conceived in 6 months recommended appointment to do labs and possible REI referral. Patient my benefit from [facility] - provided resources and support regarding pregnancy loss.    Please refer to After Visit Summary for other counseling recommendations.   Return in about 6 months (around 11/06/2019) for follow up conception.

## 2019-05-09 ENCOUNTER — Encounter: Payer: Self-pay | Admitting: Family Medicine

## 2019-05-26 ENCOUNTER — Other Ambulatory Visit: Payer: Self-pay | Admitting: *Deleted

## 2019-05-26 ENCOUNTER — Telehealth: Payer: Self-pay | Admitting: *Deleted

## 2019-05-26 DIAGNOSIS — O3680X0 Pregnancy with inconclusive fetal viability, not applicable or unspecified: Secondary | ICD-10-CM | POA: Diagnosis not present

## 2019-05-26 NOTE — Telephone Encounter (Signed)
Called pt back from her mychart message about having a positive UPT. Per Dr Alvester Morin pt needs to get serial HCG's due to her history. Pt will get them drawn today and Thursday.

## 2019-05-27 LAB — BETA HCG QUANT (REF LAB): hCG Quant: 66 m[IU]/mL

## 2019-05-28 DIAGNOSIS — O3680X0 Pregnancy with inconclusive fetal viability, not applicable or unspecified: Secondary | ICD-10-CM | POA: Diagnosis not present

## 2019-05-29 ENCOUNTER — Other Ambulatory Visit: Payer: Self-pay | Admitting: *Deleted

## 2019-05-29 DIAGNOSIS — O3680X0 Pregnancy with inconclusive fetal viability, not applicable or unspecified: Secondary | ICD-10-CM

## 2019-05-29 LAB — BETA HCG QUANT (REF LAB): hCG Quant: 153 m[IU]/mL

## 2019-06-02 DIAGNOSIS — O3680X0 Pregnancy with inconclusive fetal viability, not applicable or unspecified: Secondary | ICD-10-CM | POA: Diagnosis not present

## 2019-06-03 LAB — BETA HCG QUANT (REF LAB): hCG Quant: 1208 m[IU]/mL

## 2019-06-04 DIAGNOSIS — O3680X0 Pregnancy with inconclusive fetal viability, not applicable or unspecified: Secondary | ICD-10-CM | POA: Diagnosis not present

## 2019-06-05 ENCOUNTER — Encounter: Payer: Self-pay | Admitting: *Deleted

## 2019-06-05 LAB — BETA HCG QUANT (REF LAB): hCG Quant: 2389 m[IU]/mL

## 2019-06-09 ENCOUNTER — Other Ambulatory Visit: Payer: Self-pay

## 2019-06-09 ENCOUNTER — Telehealth: Payer: Self-pay

## 2019-06-09 ENCOUNTER — Other Ambulatory Visit: Payer: 59

## 2019-06-09 DIAGNOSIS — O26859 Spotting complicating pregnancy, unspecified trimester: Secondary | ICD-10-CM

## 2019-06-09 NOTE — Telephone Encounter (Signed)
Patient to come in for a bhcg this am since she is having some mild spotting and had a recent miscarriage.

## 2019-06-10 LAB — BETA HCG QUANT (REF LAB): hCG Quant: 9569 m[IU]/mL

## 2019-06-11 ENCOUNTER — Other Ambulatory Visit: Payer: 59

## 2019-06-11 ENCOUNTER — Other Ambulatory Visit: Payer: Self-pay

## 2019-06-11 DIAGNOSIS — Z3201 Encounter for pregnancy test, result positive: Secondary | ICD-10-CM

## 2019-06-12 LAB — BETA HCG QUANT (REF LAB): hCG Quant: 17643 m[IU]/mL

## 2019-06-15 ENCOUNTER — Other Ambulatory Visit: Payer: Self-pay | Admitting: *Deleted

## 2019-06-15 ENCOUNTER — Telehealth: Payer: Self-pay | Admitting: *Deleted

## 2019-06-15 DIAGNOSIS — O26859 Spotting complicating pregnancy, unspecified trimester: Secondary | ICD-10-CM

## 2019-06-15 NOTE — Telephone Encounter (Signed)
-----   Message from Federico Flake, MD sent at 06/12/2019  4:42 PM EDT ----- BHCG is rising. Reassuring. I recommend in office confirmation of IUP/Dating. Likely normal progressing pregnancy. Recommend another set up bhcg next week. Needs [place] in early July.   UPT positive 05/26/19 66-->153  131% increase 1208--->2389  97% increase 9569 -->17634  84% increase

## 2019-06-15 NOTE — Telephone Encounter (Signed)
Called pt to schedule her next set of HCG's and viability scan on July 6th.

## 2019-06-16 DIAGNOSIS — O26859 Spotting complicating pregnancy, unspecified trimester: Secondary | ICD-10-CM | POA: Diagnosis not present

## 2019-06-17 LAB — BETA HCG QUANT (REF LAB): hCG Quant: 34853 m[IU]/mL

## 2019-06-18 DIAGNOSIS — O26859 Spotting complicating pregnancy, unspecified trimester: Secondary | ICD-10-CM | POA: Diagnosis not present

## 2019-06-19 LAB — BETA HCG QUANT (REF LAB): hCG Quant: 41421 m[IU]/mL

## 2019-07-07 ENCOUNTER — Other Ambulatory Visit: Payer: Self-pay

## 2019-07-07 ENCOUNTER — Ambulatory Visit (INDEPENDENT_AMBULATORY_CARE_PROVIDER_SITE_OTHER): Payer: 59 | Admitting: *Deleted

## 2019-07-07 VITALS — BP 128/84 | HR 118 | Wt 212.0 lb

## 2019-07-07 DIAGNOSIS — O0911 Supervision of pregnancy with history of ectopic or molar pregnancy, first trimester: Secondary | ICD-10-CM | POA: Diagnosis not present

## 2019-07-07 DIAGNOSIS — Z3A09 9 weeks gestation of pregnancy: Secondary | ICD-10-CM | POA: Diagnosis not present

## 2019-07-07 DIAGNOSIS — Z3480 Encounter for supervision of other normal pregnancy, unspecified trimester: Secondary | ICD-10-CM

## 2019-07-07 NOTE — Progress Notes (Signed)
   DATING AND VIABILITY SONOGRAM   Brandi Higgins is a 29 y.o. year old G2P0010 with LMP No LMP recorded (lmp unknown). Patient is pregnant. She is here today for a confirmatory initial sonogram. Pt had serial HCG's done due to history of ectopic pregnancy in April 2021. Pt did not have a period after her ectopic. Pt took first UPT on 5/25 which resulted positive.   Pt denies any vaginal bleeding or cramping.      GESTATION: SINGLETON yes    FETAL ACTIVITY:          Heart rate       166          The fetus is active.   GESTATIONAL AGE AND  BIOMETRICS:  Gestational criteria: Estimated Date of Delivery: 02/03/20 by early ultrasound now at [redacted]w[redacted]d  Previous Scans:0  GESTATIONAL SAC            mm          weeks  CROWN RUMP LENGTH           3.03 mm         9.6 weeks                                                   AVERAGE EGA(BY THIS SCAN):  9.6 weeks  WORKING EDD( early ultrasound ):  02/03/2020     TECHNICIAN COMMENTS:  Patient informed that the ultrasound is considered a limited obstetric ultrasound and is not intended to be a complete ultrasound exam. Patient also informed that the ultrasound is not being completed with the intent of assessing for fetal or placental anomalies or any pelvic abnormalities. Explained that the purpose of today's ultrasound is to assess for fetal heart rate. Patient acknowledges the purpose of the exam and the limitations of the study.       A copy of this report including all images has been saved and backed up to a second source for retrieval if needed. All measures and details of the anatomical scan, placentation, fluid volume and pelvic anatomy are contained in that report.  Ultrasound images reviewed by Dr Shawnie Pons.   Brandi Higgins 07/07/2019 8:56 AM

## 2019-07-07 NOTE — Progress Notes (Signed)
Patient seen and assessed by nursing staff.  Agree with documentation and plan. I reviewed the images with the nurse, agree with the interpretation

## 2019-07-22 ENCOUNTER — Other Ambulatory Visit: Payer: Self-pay

## 2019-07-22 ENCOUNTER — Ambulatory Visit (INDEPENDENT_AMBULATORY_CARE_PROVIDER_SITE_OTHER): Payer: 59 | Admitting: Advanced Practice Midwife

## 2019-07-22 ENCOUNTER — Encounter: Payer: Self-pay | Admitting: Advanced Practice Midwife

## 2019-07-22 ENCOUNTER — Other Ambulatory Visit (HOSPITAL_COMMUNITY)
Admission: RE | Admit: 2019-07-22 | Discharge: 2019-07-22 | Disposition: A | Discharge status: Home / Self Care | Payer: 59 | Source: Ambulatory Visit | Attending: Advanced Practice Midwife | Admitting: Advanced Practice Midwife

## 2019-07-22 VITALS — BP 131/81 | HR 98 | Wt 212.0 lb

## 2019-07-22 DIAGNOSIS — E669 Obesity, unspecified: Secondary | ICD-10-CM

## 2019-07-22 DIAGNOSIS — Z3480 Encounter for supervision of other normal pregnancy, unspecified trimester: Secondary | ICD-10-CM | POA: Insufficient documentation

## 2019-07-22 DIAGNOSIS — R1110 Vomiting, unspecified: Secondary | ICD-10-CM

## 2019-07-22 DIAGNOSIS — Z3481 Encounter for supervision of other normal pregnancy, first trimester: Secondary | ICD-10-CM | POA: Diagnosis not present

## 2019-07-22 DIAGNOSIS — Z3A12 12 weeks gestation of pregnancy: Secondary | ICD-10-CM | POA: Diagnosis not present

## 2019-07-22 DIAGNOSIS — O219 Vomiting of pregnancy, unspecified: Secondary | ICD-10-CM

## 2019-07-22 DIAGNOSIS — O99211 Obesity complicating pregnancy, first trimester: Secondary | ICD-10-CM

## 2019-07-22 LAB — OB RESULTS CONSOLE GC/CHLAMYDIA: Gonorrhea: NEGATIVE

## 2019-07-22 MED ORDER — ASPIRIN EC 81 MG PO TBEC
81.0000 mg | DELAYED_RELEASE_TABLET | Freq: Every day | ORAL | 2 refills | Status: DC
Start: 2019-07-22 — End: 2019-11-20

## 2019-07-22 NOTE — Progress Notes (Signed)
History:   Brandi Higgins is a 29 y.o. G2P0010 at 106w0d by early ultrasound being seen today for her first obstetrical visit.  Her obstetrical history is significant for obesity and history of ectopic pregnancy. Patient does intend to breast feed. Pregnancy history fully reviewed.  Patient reports nausea, no vomiting. She is successfully managing this complaint with peppermints and avoiding strong smells.   Patient works as a Sales executive and her husband Onalee Hua is a IT sales professional. She is a non-smoker.      HISTORY: OB History  Gravida Para Term Preterm AB Living  2 0 0 0 1 0  SAB TAB Ectopic Multiple Live Births  0 0 1 0 0    # Outcome Date GA Lbr Len/2nd Weight Sex Delivery Anes PTL Lv  2 Current           1 Ectopic             Last pap smear was done 12/2017 and was normal  Past Medical History:  Diagnosis Date  . Abnormal Pap smear of vagina   . Heart murmur   . History of ectopic pregnancy   . Migraines   . Swelling    Past Surgical History:  Procedure Laterality Date  . SHOULDER SURGERY    . WISDOM TOOTH EXTRACTION    . WRIST SURGERY     Family History  Problem Relation Age of Onset  . Diabetes Maternal Grandmother   . Diabetes Paternal Grandmother   . Cancer Neg Hx   . Heart disease Neg Hx    Social History   Tobacco Use  . Smoking status: Never Smoker  . Smokeless tobacco: Never Used  Vaping Use  . Vaping Use: Never used  Substance Use Topics  . Alcohol use: Not Currently    Comment: occas  . Drug use: No   No Known Allergies Current Outpatient Medications on File Prior to Visit  Medication Sig Dispense Refill  . Prenatal 27-1 MG TABS Take by mouth.     No current facility-administered medications on file prior to visit.    Review of Systems Pertinent items noted in HPI and remainder of comprehensive ROS otherwise negative. Physical Exam:   Vitals:   07/22/19 0853  BP: 131/81  Pulse: 98  Weight: 212 lb (96.2 kg)   Fetal Heart  Rate (bpm): 164 Bedside Ultrasound for FHR check: Viable intrauterine pregnancy with positive cardiac activity note. Patient informed that the ultrasound is considered a limited obstetric ultrasound and is not intended to be a complete ultrasound exam.  Patient also informed that the ultrasound is not being completed with the intent of assessing for fetal or placental anomalies or any pelvic abnormalities.  Explained that the purpose of today's ultrasound is to assess for fetal heart rate.  Patient acknowledges the purpose of the exam and the limitations of the study. Pelvic Exam: Deferred   System: General: well-developed, well-nourished female in no acute distress   Breasts:  normal appearance, no masses or tenderness bilaterally   Skin: normal coloration and turgor, no rashes   Neurologic: oriented, normal, negative, normal mood   Extremities: normal strength, tone, and muscle mass, ROM of all joints is normal   HEENT PERRLA, extraocular movement intact and sclera clear, anicteric   Mouth/Teeth mucous membranes moist, pharynx normal without lesions and dental hygiene good   Neck supple and no masses   Cardiovascular: regular rate and rhythm   Respiratory:  no respiratory distress, normal breath sounds  Abdomen: soft, non-tender; bowel sounds normal; no masses,  no organomegaly    Assessment:    Pregnancy: G2P0010 Patient Active Problem List   Diagnosis Date Noted  . Pregnancy of unknown anatomic location 04/03/2019  . 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 Migraine without aura and without status migrainosus, not intractable 01/04/2014   Meds ordered this encounter  Medications  . aspirin EC 81 MG tablet    Sig: Take 1 tablet (81 mg total) by mouth daily. Take after 12 weeks for prevention of preeclampsia later in pregnancy    Dispense:  300 tablet    Refill:  2    Order Specific Question:   Supervising Provider    Answer:   Reva Bores [[id number]]      Plan:    1. Supervision of other normal pregnancy, antepartum - Routine care - S/p viability scan for history of ectopic - CBC/D/Plt+RPR+Rh+ABO+Rub Ab... - Culture, OB Urine - GC/Chlamydia probe amp ([place])not at [facility] - Genetic Screening - US MFM OB COMP + 14 WK; Future - Babyscripts Schedule Optimization - Comprehensive metabolic panel - Hemoglobin A1c - TSH  2. Obesity (BMI 30.0-34.9) - Weight gain 11-20 lbs - Baseline labs today  3. Non-intractable vomiting, presence of nausea not specified, unspecified vomiting type - Nausea without vomiting - Continue peppermints, scent/trigger avoidance   Initial labs drawn. Continue prenatal vitamins. Problem list reviewed and updated. Genetic Screening discussed, First trimester screen, Quad screen and NIPS: ordered. Ultrasound discussed; fetal anatomic survey: ordered. Anticipatory guidance about prenatal visits given including labs, ultrasounds, and testing. Discussed usage of Babyscripts and virtual visits as additional source of managing and completing prenatal visits in midst of coronavirus and pandemic.   Encouraged to complete MyChart Registration for her ability to review results, send requests, and have questions addressed.  The nature of [place] [facility] with multiple MDs and Advanced Practice Providers was explained to patient; also emphasized that residents, students are part of our team. Routine obstetric precautions reviewed. Encouraged to seek out care at office or emergency room [facility] preferred) for urgent and/or emergent concerns. Return in about 4 weeks (around 08/19/2019).     Clayton Bibles, MSN, CNM Certified Nurse Midwife, [employer] for [employer], [facility] 07/22/19 12:30 PM

## 2019-07-22 NOTE — Patient Instructions (Signed)

## 2019-07-23 LAB — CBC/D/PLT+RPR+RH+ABO+RUB AB...
Antibody Screen: NEGATIVE
Basophils Absolute: 0 10*3/uL (ref 0.0–0.2)
Basos: 0 %
EOS (ABSOLUTE): 0.1 10*3/uL (ref 0.0–0.4)
Eos: 1 %
HCV Ab: <0.1 s/co ratio (ref 0.0–0.9)
HIV Screen 4th Generation wRfx: NONREACTIVE
Hematocrit: 39.6 % (ref 34.0–46.6)
Hemoglobin: 13 g/dL (ref 11.1–15.9)
Hepatitis B Surface Ag: NEGATIVE
Immature Grans (Abs): 0 10*3/uL (ref 0.0–0.1)
Immature Granulocytes: 0 %
Lymphocytes Absolute: 1.4 10*3/uL (ref 0.7–3.1)
Lymphs: 15 %
MCH: 29.2 pg (ref 26.6–33.0)
MCHC: 32.8 g/dL (ref 31.5–35.7)
MCV: 89 fL (ref 79–97)
Monocytes Absolute: 0.5 10*3/uL (ref 0.1–0.9)
Monocytes: 6 %
Neutrophils Absolute: 6.8 10*3/uL (ref 1.4–7.0)
Neutrophils: 78 %
Platelets: 244 10*3/uL (ref 150–450)
RBC: 4.45 x10E6/uL (ref 3.77–5.28)
RDW: 12.4 % (ref 11.7–15.4)
RPR Ser Ql: NONREACTIVE
Rh Factor: POSITIVE
Rubella Antibodies, IGG: 1.68 index (ref >0.99)
WBC: 8.8 10*3/uL (ref 3.4–10.8)

## 2019-07-23 LAB — COMPREHENSIVE METABOLIC PANEL
ALT: 10 IU/L (ref 0–32)
AST: 10 IU/L (ref 0–40)
Albumin/Globulin Ratio: 1.7 (ref 1.2–2.2)
Albumin: 4.3 g/dL (ref 3.8–4.8)
Alkaline Phosphatase: 57 IU/L (ref 48–121)
BUN/Creatinine Ratio: 13 (ref 9–23)
BUN: 8 mg/dL (ref 6–20)
Bilirubin Total: 0.4 mg/dL (ref 0.0–1.2)
CO2: 18 mmol/L — ABNORMAL LOW (ref 20–29)
Calcium: 9.1 mg/dL (ref 8.7–10.2)
Chloride: 104 mmol/L (ref 96–106)
Creatinine, Ser: 0.62 mg/dL (ref 0.57–1.00)
GFR calc Af Amer: 139 mL/min/{1.73_m2} (ref >59)
GFR calc non Af Amer: 121 mL/min/{1.73_m2} (ref >59)
Globulin, Total: 2.6 g/dL (ref 1.5–4.5)
Glucose: 92 mg/dL (ref 65–99)
Potassium: 4 mmol/L (ref 3.5–5.2)
Sodium: 135 mmol/L (ref 134–144)
Total Protein: 6.9 g/dL (ref 6.0–8.5)

## 2019-07-23 LAB — GC/CHLAMYDIA PROBE AMP (~~LOC~~) NOT AT ARMC
Chlamydia: NEGATIVE
Comment: NEGATIVE
Comment: NORMAL
Neisseria Gonorrhea: NEGATIVE

## 2019-07-23 LAB — TSH: TSH: 1.25 u[IU]/mL (ref 0.450–4.500)

## 2019-07-23 LAB — HCV INTERPRETATION

## 2019-07-23 LAB — HEMOGLOBIN A1C
Est. average glucose Bld gHb Est-mCnc: 103 mg/dL
Hgb A1c MFr Bld: 5.2 % (ref 4.8–5.6)

## 2019-07-25 LAB — CULTURE, OB URINE

## 2019-07-25 LAB — URINE CULTURE, OB REFLEX

## 2019-07-30 ENCOUNTER — Encounter: Payer: Self-pay | Admitting: *Deleted

## 2019-07-30 ENCOUNTER — Telehealth: Payer: Self-pay | Admitting: *Deleted

## 2019-07-30 NOTE — Telephone Encounter (Signed)
Pt informed of panorama results, pt did not want to know gender. Made aware not to look in her mychart until she finds out.

## 2019-08-11 DIAGNOSIS — Z3482 Encounter for supervision of other normal pregnancy, second trimester: Secondary | ICD-10-CM | POA: Diagnosis not present

## 2019-08-11 DIAGNOSIS — Z6835 Body mass index (BMI) 35.0-35.9, adult: Secondary | ICD-10-CM | POA: Diagnosis not present

## 2019-08-14 DIAGNOSIS — Z6835 Body mass index (BMI) 35.0-35.9, adult: Secondary | ICD-10-CM | POA: Diagnosis not present

## 2019-08-14 DIAGNOSIS — Z3482 Encounter for supervision of other normal pregnancy, second trimester: Secondary | ICD-10-CM | POA: Diagnosis not present

## 2019-08-14 LAB — OB RESULTS CONSOLE VARICELLA ZOSTER ANTIBODY, IGG: Varicella: IMMUNE

## 2019-08-24 ENCOUNTER — Encounter: Payer: 59 | Admitting: Obstetrics and Gynecology

## 2019-08-28 DIAGNOSIS — R7309 Other abnormal glucose: Secondary | ICD-10-CM | POA: Diagnosis not present

## 2019-09-08 ENCOUNTER — Other Ambulatory Visit: Payer: 59

## 2019-09-08 DIAGNOSIS — Z1152 Encounter for screening for COVID-19: Secondary | ICD-10-CM

## 2019-09-08 NOTE — Progress Notes (Signed)
Covid testing - spouse of employee.

## 2019-09-10 DIAGNOSIS — Z03818 Encounter for observation for suspected exposure to other biological agents ruled out: Secondary | ICD-10-CM | POA: Diagnosis not present

## 2019-09-10 DIAGNOSIS — Z1152 Encounter for screening for COVID-19: Secondary | ICD-10-CM | POA: Diagnosis not present

## 2019-09-10 LAB — NOVEL CORONAVIRUS, NAA: SARS-CoV-2, NAA: NOT DETECTED

## 2019-09-10 LAB — SARS-COV-2, NAA 2 DAY TAT

## 2019-09-11 ENCOUNTER — Ambulatory Visit: Payer: 59

## 2019-11-03 DIAGNOSIS — Z3483 Encounter for supervision of other normal pregnancy, third trimester: Secondary | ICD-10-CM | POA: Diagnosis not present

## 2019-11-03 DIAGNOSIS — Z3482 Encounter for supervision of other normal pregnancy, second trimester: Secondary | ICD-10-CM | POA: Diagnosis not present

## 2019-11-13 DIAGNOSIS — Z23 Encounter for immunization: Secondary | ICD-10-CM | POA: Diagnosis not present

## 2019-11-13 DIAGNOSIS — Z3482 Encounter for supervision of other normal pregnancy, second trimester: Secondary | ICD-10-CM | POA: Diagnosis not present

## 2019-11-20 ENCOUNTER — Encounter: Payer: Self-pay | Admitting: *Deleted

## 2019-11-20 ENCOUNTER — Encounter: Payer: 59 | Attending: Obstetrics and Gynecology | Admitting: *Deleted

## 2019-11-20 ENCOUNTER — Other Ambulatory Visit: Payer: Self-pay

## 2019-11-20 VITALS — BP 110/74 | Ht 65.0 in | Wt 226.3 lb

## 2019-11-20 DIAGNOSIS — O24410 Gestational diabetes mellitus in pregnancy, diet controlled: Secondary | ICD-10-CM

## 2019-11-20 DIAGNOSIS — H52229 Regular astigmatism, unspecified eye: Secondary | ICD-10-CM | POA: Diagnosis not present

## 2019-11-20 DIAGNOSIS — Z3A00 Weeks of gestation of pregnancy not specified: Secondary | ICD-10-CM | POA: Insufficient documentation

## 2019-11-20 DIAGNOSIS — O24419 Gestational diabetes mellitus in pregnancy, unspecified control: Secondary | ICD-10-CM | POA: Insufficient documentation

## 2019-11-20 DIAGNOSIS — Z135 Encounter for screening for eye and ear disorders: Secondary | ICD-10-CM | POA: Diagnosis not present

## 2019-11-20 NOTE — Patient Instructions (Signed)
Read booklet on Gestational Diabetes Follow Gestational Meal Planning Guidelines Avoid fruit for breakfast if blood sugars elevated Avoid cold cereal for breakfast Limit desserts/sweets Complete a 3 Day Food Record and bring to next appointment Check blood sugars 4 x day - before breakfast and 2 hrs after every meal and record  Bring blood sugar log to all appointments Call MD for prescription for meter strips and lancets Strips   One Touch Verio  Lancets  One Touch Delica Plus Purchase urine ketone strips if instructed by MD and check urine ketones every am:  If + increase bedtime snack to 1 protein and 2 carbohydrate servings Walk 20-30 minutes at least 5 x week if permitted by MD

## 2019-11-20 NOTE — Progress Notes (Signed)
Diabetes Self-Management Education  Visit Type: First/Initial  Appt. Start Time: 0825 Appt. End Time: 0950  11/20/2019  [name], identified by name and date of birth, is a 29 y.o. female with a diagnosis of Diabetes: Gestational Diabetes.   ASSESSMENT  Blood pressure 110/74, height 5\' 5"  (1.651 m), weight 226 lb 4.8 oz (102.6 kg), last menstrual period unknown, estimated date of delivery 02/03/2020 Body mass index is 37.66 kg/m.   Diabetes Self-Management Education - 11/20/19 0927      Visit Information   Visit Type First/Initial      Initial Visit   Diabetes Type Gestational Diabetes    Are you currently following a meal plan? No    Are you taking your medications as prescribed? Yes    Date Diagnosed 1 week      Health Coping   How would you rate your overall health? Fair      Psychosocial Assessment   Patient Belief/Attitude about Diabetes Motivated to manage diabetes   ready to become a healthier person   Self-care barriers None    Self-management support Doctor's office;Family    Patient Concerns Nutrition/Meal planning;Glycemic Control;Monitoring;Weight Control;Healthy Lifestyle    Special Needs None    Preferred Learning Style Auditory;Visual    Learning Readiness Ready    How often do you need to have someone help you when you read instructions, pamphlets, or other written materials from your doctor or pharmacy? 1 - Never    What is the last grade level you completed in school? Associates      Pre-Education Assessment   Patient understands the diabetes disease and treatment process. Needs Instruction    Patient understands incorporating nutritional management into lifestyle. Needs Instruction    Patient undertands incorporating physical activity into lifestyle. Needs Instruction    Patient understands using medications safely. Needs Instruction    Patient understands monitoring blood glucose, interpreting and using results Needs Instruction    Patient  understands prevention, detection, and treatment of acute complications. Needs Instruction    Patient understands prevention, detection, and treatment of chronic complications. Needs Instruction    Patient understands how to develop strategies to address psychosocial issues. Needs Instruction    Patient understands how to develop strategies to promote health/change behavior. Needs Instruction      Complications   Last HgB A1C per patient/outside source 5.2 %   07/22/2019   How often do you check your blood sugar? 0 times/day (not testing)   Provided One Touch Verio Flex meter and instructed on use. BG upon return demonstration was 84 mg/dL at 07/24/2019 am - 2 hrs pp.   Have you had a dilated eye exam in the past 12 months? Yes    Have you had a dental exam in the past 12 months? No    Are you checking your feet? No      Dietary Intake   Breakfast Greek yogurt with strawberries and granola; cereal and milk; bacon, egg and cheese sandwich    Snack (morning) bell pepper and cream cheese; peanut butter crackers; apple    Lunch left-overs    Dinner chicken, beef, pork; green beans, brussels sprouts, broccoli, cauliflower, salads - lettuce, tomatoes, cuccumbers, carrots, peppers; potatoes and corn 1 x week; spaghetti 1-2 x month    Beverage(s) water      Exercise   Exercise Type ADL's      Patient Education   Previous Diabetes Education No    Disease state  Definition of diabetes, type  1 and 2, and the diagnosis of diabetes;Factors that contribute to the development of diabetes    Nutrition management  Role of diet in the treatment of diabetes and the relationship between the three main macronutrients and blood glucose level;Food label reading, portion sizes and measuring food.;Reviewed blood glucose goals for pre and post meals and how to evaluate the patients' food intake on their blood glucose level.    Physical activity and exercise  Role of exercise on diabetes management, blood pressure control  and cardiac health.    Medications Other (comment)   Limited US of oral medications during pregnancy and potential for insulin.   Monitoring Taught/evaluated SMBG meter.;Purpose and frequency of SMBG.;Taught/discussed recording of test results and interpretation of SMBG.;Identified appropriate SMBG and/or A1C goals.;Ketone testing, when, how.    Chronic complications Relationship between chronic complications and blood glucose control    Psychosocial adjustment Identified and addressed patients feelings and concerns about diabetes    Preconception care Pregnancy and GDM  Role of pre-pregnancy blood glucose control on the development of the fetus;Reviewed with patient blood glucose goals with pregnancy;Role of family planning for patients with diabetes      Individualized Goals (developed by patient)   Reducing Risk Other (comment)   improve blood sugars, prevent diabetes complications, lose weight, lead a healthier lifestyle, become more fit     Outcomes   Expected Outcomes Demonstrated interest in learning. Expect positive outcomes           Individualized Plan for Diabetes Self-Management Training:   Learning Objective:  Patient will have a greater understanding of diabetes self-management. Patient education plan is to attend individual and/or group sessions per assessed needs and concerns.   Plan:   Patient Instructions  Read booklet on Gestational Diabetes Follow Gestational Meal Planning Guidelines Avoid fruit for breakfast if blood sugars elevated Avoid cold cereal for breakfast Limit desserts/sweets Complete a 3 Day Food Record and bring to next appointment Check blood sugars 4 x day - before breakfast and 2 hrs after every meal and record  Bring blood sugar log to all appointments Call MD for prescription for meter strips and lancets Strips   One Touch Verio  Lancets  One Touch Delica Plus Purchase urine ketone strips if instructed by MD and check urine ketones every am:   If + increase bedtime snack to 1 protein and 2 carbohydrate servings Walk 20-30 minutes at least 5 x week if permitted by MD  Expected Outcomes:  Demonstrated interest in learning. Expect positive outcomes  Education material provided:  Gestational Booklet Gestational Meal Planning Guidelines Simple Meal Plan Viewed Gestational Diabetes Video Meter - One Touch Verio Flex 3 Day Food Record Goals for a Healthy Pregnancy  If problems or questions, patient to contact team via:  Sharion Settler, RN, CCM, CDCES [phone]  Future DSME appointment:  November 30, 2019 with the dietitian

## 2019-11-30 ENCOUNTER — Encounter: Payer: Self-pay | Admitting: Dietician

## 2019-11-30 ENCOUNTER — Other Ambulatory Visit: Payer: Self-pay

## 2019-11-30 ENCOUNTER — Encounter: Payer: 59 | Admitting: Dietician

## 2019-11-30 VITALS — BP 118/80 | Ht 65.0 in | Wt 228.3 lb

## 2019-11-30 DIAGNOSIS — O24419 Gestational diabetes mellitus in pregnancy, unspecified control: Secondary | ICD-10-CM | POA: Diagnosis not present

## 2019-11-30 DIAGNOSIS — Z3A00 Weeks of gestation of pregnancy not specified: Secondary | ICD-10-CM | POA: Diagnosis not present

## 2019-11-30 DIAGNOSIS — O24410 Gestational diabetes mellitus in pregnancy, diet controlled: Secondary | ICD-10-CM

## 2019-11-30 NOTE — Patient Instructions (Signed)
   Speak with doctor about options for controlling heartburn and nausea. More restful sleep at night might  help improve fasting blood sugars.  Great job controlling carb intake. Keeping meals small and eating often during the day can help with nausea and blood sugar control.

## 2019-11-30 NOTE — Progress Notes (Signed)
.   Patient's BG record indicates fasting BGs ranging 90-128, and post-meal BGs ranging 77-123mg /dl. . Patient's food diary indicates meals and snacks at regular intervals and generally controlled carb intake. She reports feeling full more quickly than prior to pregnancy, also having more nausea and heartburn in recent weeks.  She has been waking up about 1am with heartburn most nights.  . Provided basic meal plan, and wrote individualized menus based on patient's food preferences. Reviewed basic meal planning.  . Discussed food choices and importance of small meals and snacks to reduce risk of heartburn symptoms. . Instructed patient on food safety, including avoidance of Listeriosis, and limiting mercury from fish. . Discussed importance of maintaining healthy lifestyle habits to reduce risk of Type 2 DM as well as Gestational DM with any future pregnancies. . Advised patient to use any remaining testing supplies to test some BGs after delivery, and to have BG tested ideally annually, as well as prior to attempting future pregnancies.

## 2019-12-22 DIAGNOSIS — G5603 Carpal tunnel syndrome, bilateral upper limbs: Secondary | ICD-10-CM | POA: Diagnosis not present

## 2020-01-02 NOTE — L&D Delivery Note (Signed)
Delivery Note  First Stage: Labor onset: 01/18/20 at 2030 Augmentation: AROM, Cook cath, cytotec, Pitocin Analgesia /Anesthesia intrapartum: epidural AROM 01/18/20 at 2156  Second Stage: Complete dilation 01/19/20 at 0030 Onset of pushing 01/19/20 at 0030 FHR second stage Cat II, variable decels  Delivery of a viable female on 01/19/20 at 0308 by CNM delivery of fetal head in LOA position with restitution to LOT. NO nuchal cord;  Nuchal posterior left arm, Anterior then posterior shoulders not delivered easily with gentle downward traction. Repositioned mother with HOB down and legs in McRoberts position. Anterior shoulder delivered easily but tight fit due to nuchal arm, followed by posterior shoulder. Baby placed on mom's chest, and attended to by peds.  Cord double clamped after , then clamped and cut.    Third Stage: Placenta delivered spontaneously intact with 3VC @ [phone] Placenta disposition: to pathology, noted succenturiate lobe.  Uterine tone firm / bleeding small.   Perineal laceration identified, 3B- see Dr Francena Hanly procedure note.  Anesthesia for repair: epidural  Est. Blood Loss (mL):  Complications: none  Mom to postpartum.  Baby to Couplet care / Skin to Skin.  Newborn: Birth Weight: pending  Apgar Scores: 6/9 Feeding planned: breast

## 2020-01-08 DIAGNOSIS — Z3483 Encounter for supervision of other normal pregnancy, third trimester: Secondary | ICD-10-CM | POA: Diagnosis not present

## 2020-01-08 LAB — OB RESULTS CONSOLE GBS: GBS: NEGATIVE

## 2020-01-14 ENCOUNTER — Other Ambulatory Visit: Payer: Self-pay

## 2020-01-14 ENCOUNTER — Encounter: Payer: 59 | Attending: Obstetrics and Gynecology | Admitting: *Deleted

## 2020-01-14 ENCOUNTER — Encounter: Payer: Self-pay | Admitting: *Deleted

## 2020-01-14 VITALS — BP 120/84 | Wt 240.7 lb

## 2020-01-14 DIAGNOSIS — O24410 Gestational diabetes mellitus in pregnancy, diet controlled: Secondary | ICD-10-CM | POA: Diagnosis not present

## 2020-01-14 NOTE — Progress Notes (Signed)
Diabetes Self-Management Education  Visit Type: Follow-up  Appt. Start Time: 1315 Appt. End Time: 1345  01/14/2020  [name], identified by name and date of birth, is a 30 y.o. female with a diagnosis of Diabetes:  .   ASSESSMENT  Blood pressure 120/84, weight 240 lb 11.2 oz (109.2 kg), estimated date of delivery 02/03/2020 Body mass index is 40.05 kg/m.   Diabetes Self-Management Education - 01/14/20 1500      Visit Information   Visit Type Follow-up      Complications   How often do you check your blood sugar? 3-4 times/day    Fasting Blood glucose range (mg/dL) 46-270   FBG in the last week 79-86 mg/dL   Postprandial Blood glucose range (mg/dL) 35-009   pp's all less than 120 mg/dL     Dietary Intake   Breakfast 3 meals and 1 snack/day; has been eating a snack in the middle of the night when FBG's were over 95 mg/dL      Exercise   Exercise Type ADL's      Patient Education   Nutrition management  Reviewed blood glucose goals for pre and post meals and how to evaluate the patients' food intake on their blood glucose level.;Meal timing in regards to the patients' current diabetes medication.    Physical activity and exercise  Role of exercise on diabetes management, blood pressure control and cardiac health.    Medications Taught/reviewed insulin injection, site rotation, insulin storage and needle disposal.;Reviewed patients medication for diabetes, action, purpose, timing of dose and side effects.   Pt injected 5 units NS to left abdomen subcutaneously without difficulty.   Monitoring Taught/discussed recording of test results and interpretation of SMBG.    Acute complications Taught treatment of hypoglycemia - the 15 rule.    Preconception care Reviewed with patient blood glucose goals with pregnancy      Post-Education Assessment   Patient understands incorporating nutritional management into lifestyle. Demonstrates understanding / competency    Patient  undertands incorporating physical activity into lifestyle. Demonstrates understanding / competency    Patient understands using medications safely. Demonstrates understanding / competency    Patient understands monitoring blood glucose, interpreting and using results Demonstrates understanding / competency    Patient understands prevention, detection, and treatment of acute complications. Demonstrates understanding / competency      Outcomes   Expected Outcomes Demonstrated interest in learning. Expect positive outcomes    Program Status Completed      Subsequent Visit   Since your last visit have you continued or begun to take your medications as prescribed? Yes    Since your last visit have you had your blood pressure checked? Yes    Is your most recent blood pressure lower, unchanged, or higher since your last visit? Unchanged    Since your last visit have you experienced any weight changes? Gain    Weight Gain (lbs) 14    Since your last visit, are you checking your blood glucose at least once a day? Yes           Individualized Plan for Diabetes Self-Management Training:   Learning Objective:  Patient will have a greater understanding of diabetes self-management. Patient education plan is to attend individual and/or group sessions per assessed needs and concerns.   Plan:   Patient Instructions     Follow Gestational Meal Planning Guidelines Check blood sugars 4 x day - before breakfast and 2 hrs after every meal and record  Bring  blood sugar log to MD appointments  Walk 20-30 minutes at least 5 x week if permitted by MD  Carry fast acting glucose and a snack at all times Rotate injection sites Give evening insulin 30 minutes before supper  Evening insulin Humulin/Novolin    N (cloudy)             20    units  Expected Outcomes:  Demonstrated interest in learning. Expect positive outcomes  Education material provided:  Injection Guide (BD) Glucose  tablets Symptoms, causes and treatments of Hypoglycemia  If problems or questions, patient to contact team via:  Sharion Settler, RN, CCM, CDCES [phone]  Future DSME appointment:  PRN

## 2020-01-14 NOTE — Patient Instructions (Signed)
  Follow Gestational Meal Planning Guidelines Check blood sugars 4 x day - before breakfast and 2 hrs after every meal and record  Bring blood sugar log to MD appointments  Walk 20-30 minutes at least 5 x week if permitted by MD  Carry fast acting glucose and a snack at all times Rotate injection sites Give evening insulin 30 minutes before supper  Evening insulin Humulin/Novolin    N (cloudy)             20    units

## 2020-01-15 ENCOUNTER — Other Ambulatory Visit: Payer: Self-pay

## 2020-01-15 ENCOUNTER — Observation Stay: Payer: 59

## 2020-01-15 ENCOUNTER — Observation Stay
Admission: EM | Admit: 2020-01-15 | Discharge: 2020-01-15 | Disposition: A | Discharge status: Home / Self Care | Payer: 59 | Source: Home / Self Care | Attending: Certified Nurse Midwife | Admitting: Certified Nurse Midwife

## 2020-01-15 ENCOUNTER — Encounter: Payer: Self-pay | Admitting: Obstetrics and Gynecology

## 2020-01-15 DIAGNOSIS — O24419 Gestational diabetes mellitus in pregnancy, unspecified control: Secondary | ICD-10-CM | POA: Diagnosis not present

## 2020-01-15 DIAGNOSIS — O163 Unspecified maternal hypertension, third trimester: Secondary | ICD-10-CM | POA: Diagnosis present

## 2020-01-15 DIAGNOSIS — O34219 Maternal care for unspecified type scar from previous cesarean delivery: Secondary | ICD-10-CM

## 2020-01-15 DIAGNOSIS — Z3A37 37 weeks gestation of pregnancy: Secondary | ICD-10-CM | POA: Insufficient documentation

## 2020-01-15 DIAGNOSIS — O162 Unspecified maternal hypertension, second trimester: Secondary | ICD-10-CM

## 2020-01-15 DIAGNOSIS — O24410 Gestational diabetes mellitus in pregnancy, diet controlled: Secondary | ICD-10-CM

## 2020-01-15 DIAGNOSIS — Z3689 Encounter for other specified antenatal screening: Secondary | ICD-10-CM

## 2020-01-15 DIAGNOSIS — O133 Gestational [pregnancy-induced] hypertension without significant proteinuria, third trimester: Principal | ICD-10-CM | POA: Insufficient documentation

## 2020-01-15 LAB — PROTEIN / CREATININE RATIO, URINE
Creatinine, Urine: 21 mg/dL
Total Protein, Urine: <6 mg/dL

## 2020-01-15 LAB — COMPREHENSIVE METABOLIC PANEL
ALT: 11 U/L (ref 0–44)
AST: 20 U/L (ref 15–41)
Albumin: 2.9 g/dL — ABNORMAL LOW (ref 3.5–5.0)
Alkaline Phosphatase: 99 U/L (ref 38–126)
Anion gap: 8 (ref 5–15)
BUN: 11 mg/dL (ref 6–20)
CO2: 19 mmol/L — ABNORMAL LOW (ref 22–32)
Calcium: 9.2 mg/dL (ref 8.9–10.3)
Chloride: 110 mmol/L (ref 98–111)
Creatinine, Ser: 0.68 mg/dL (ref 0.44–1.00)
GFR, Estimated: >60 mL/min (ref >60)
Glucose, Bld: 76 mg/dL (ref 70–99)
Potassium: 3.8 mmol/L (ref 3.5–5.1)
Sodium: 137 mmol/L (ref 135–145)
Total Bilirubin: 0.7 mg/dL (ref 0.3–1.2)
Total Protein: 6.2 g/dL — ABNORMAL LOW (ref 6.5–8.1)

## 2020-01-15 LAB — CBC
HCT: 33.8 % — ABNORMAL LOW (ref 36.0–46.0)
Hemoglobin: 11.4 g/dL — ABNORMAL LOW (ref 12.0–15.0)
MCH: 29.6 pg (ref 26.0–34.0)
MCHC: 33.7 g/dL (ref 30.0–36.0)
MCV: 87.8 fL (ref 80.0–100.0)
Platelets: 220 10*3/uL (ref 150–400)
RBC: 3.85 MIL/uL — ABNORMAL LOW (ref 3.87–5.11)
RDW: 13.8 % (ref 11.5–15.5)
WBC: 11.2 10*3/uL — ABNORMAL HIGH (ref 4.0–10.5)
nRBC: 0 % (ref 0.0–0.2)

## 2020-01-15 MED ORDER — CALCIUM CARBONATE ANTACID 500 MG PO CHEW: 2.0000 | CHEWABLE_TABLET | ORAL | Status: DC | PRN

## 2020-01-15 MED ORDER — ACETAMINOPHEN 325 MG PO TABS: 650.0000 mg | ORAL_TABLET | ORAL | Status: DC | PRN

## 2020-01-15 NOTE — Discharge Summary (Signed)
Patient ID: Brandi Higgins MRN: [id number] DOB/AGE: [birth year] 30 y.o.  Admit date: 01/15/2020 Discharge date: 01/15/2020  Admission Diagnoses: GHTN  Discharge Diagnoses: GHTN  Prenatal Procedures: NST and u/s  Consults: None  Significant Diagnostic Studies:  Results for orders placed or performed during the hospital encounter of 01/15/20 (from the past 168 hour(s))  CBC on admission   Collection Time: 01/15/20 11:15 AM  Result Value Ref Range   WBC 11.2 (H) 4.0 - 10.5 K/uL   RBC 3.85 (L) 3.87 - 5.11 MIL/uL   Hemoglobin 11.4 (L) 12.0 - 15.0 g/dL   HCT 03.0 (L) 09.2 - 33.0 %   MCV 87.8 80.0 - 100.0 fL   MCH 29.6 26.0 - 34.0 pg   MCHC 33.7 30.0 - 36.0 g/dL   RDW 07.6 22.6 - 33.3 %   Platelets 220 150 - 400 K/uL   nRBC 0.0 0.0 - 0.2 %  Comprehensive metabolic panel   Collection Time: 01/15/20 11:15 AM  Result Value Ref Range   Sodium 137 135 - 145 mmol/L   Potassium 3.8 3.5 - 5.1 mmol/L   Chloride 110 98 - 111 mmol/L   CO2 19 (L) 22 - 32 mmol/L   Glucose, Bld 76 70 - 99 mg/dL   BUN 11 6 - 20 mg/dL   Creatinine, Ser 5.45 0.44 - 1.00 mg/dL   Calcium 9.2 8.9 - 62.5 mg/dL   Total Protein 6.2 (L) 6.5 - 8.1 g/dL   Albumin 2.9 (L) 3.5 - 5.0 g/dL   AST 20 15 - 41 U/L   ALT 11 0 - 44 U/L   Alkaline Phosphatase 99 38 - 126 U/L   Total Bilirubin 0.7 0.3 - 1.2 mg/dL   GFR, Estimated >63 >89 mL/min   Anion gap 8 5 - 15  Protein / creatinine ratio, urine   Collection Time: 01/15/20 11:20 AM  Result Value Ref Range   Creatinine, Urine 21 mg/dL   Total Protein, Urine <6 mg/dL   Protein Creatinine Ratio        0.00 - 0.15 mg/mg[Cre]    Treatments: none  Hospital Course:  This is a 30 y.o. G2P0010 with IUP at [redacted]w[redacted]d sent from the office for elevated BPs.  No leaking of fluid and no bleeding and denies HA, visual changes, and epigastric pain.  Labs returned reassuring. Dr. Dalbert Garnet was consulted with and it was decided she could go home with very detailed precautions. IOL was  scheduled.  She was observed, fetal heart rate monitoring remained reassuring.  She was deemed stable for discharge to home with outpatient follow up.  Discharge Physical Exam:  BP 128/80   Pulse 95   Temp 98.4 F (36.9 C) (Oral)   Resp 16   Ht 5\' 5"  (1.651 m)   Wt 108.9 kg   LMP  (LMP Unknown)   BMI 39.94 kg/m   General: NAD CV: RRR Pulm: CTABL, nl effort ABD: s/nd/nt, gravid DVT Evaluation: LE non-ttp, no evidence of DVT on exam.  NST: FHR baseline: 160 bpm Variability: moderate Accelerations: yes Decelerations: none Time: 60 minutes Category/reactivity: reactive  TOCO: quiet SVE: deferred      Discharge Condition: Stable  Disposition: Discharge disposition: 01-Home or Self Care        Allergies as of 01/15/2020   No Known Allergies     Medication List    TAKE these medications   insulin NPH Human 100 UNIT/ML injection Commonly known as: NOVOLIN N Inject 20 Units into the skin at  bedtime.   INSULIN SYRINGE 1CC/31GX5/16" 31G X 5/16" 1 ML Misc See admin instructions.   pantoprazole 40 MG tablet Commonly known as: PROTONIX Take 40 mg by mouth daily.   Prenatal 27-1 MG Tabs Take by mouth.       Follow-up Information    [facility]. Schedule an appointment as soon as possible for a visit on 01/19/2020.   Contact information: [address] [place] [address] [phone]              Signed:  Quillian Quince 01/15/2020 9:06 PM

## 2020-01-15 NOTE — OB Triage Note (Signed)
Pt discharged in good condition w/ sig other. VSS. Pt discharge instructions given with [place] CNM and RN in room. Instructed to take spot BP's at home after 15 min of rest, to come back with any severe features including headache, visual disturbance,  Epigastric pain, ect. Additionally instructed to come back with increased ctx, vag bleeding, LOF, or decreased fetal movement. Teach back method used. Induction scheduled for 01/21/20 at 0001.

## 2020-01-15 NOTE — OB Triage Note (Incomplete)
Pt Brandi Higgins 30 y.o. presents to the ED complaining of . Pt is a G2P0010 at [redacted]w[redacted]d . Pt denies signs and symptons consistent with rupture of membranes or active vaginal bleeding. Pt denies contractions and states positive fetal movement. External FM and TOCO applied to non-tender abdomen and assessing. Initial FHR . Vital signs obtained and within normal limits. Provider notified of pt.

## 2020-01-15 NOTE — Discharge Instructions (Signed)
Hypertension During Pregnancy High blood pressure (hypertension) is when the force of blood pumping through the arteries is high enough to cause problems with your health. Arteries are blood vessels that carry blood from the heart throughout the body. Hypertension during pregnancy can cause problems for you and your baby. It can be mild or severe. There are different types of hypertension that can happen during pregnancy. These include:  Chronic hypertension. This happens when you had high blood pressure before you became pregnant, and it continues during the pregnancy. Hypertension that develops before you are [redacted] weeks pregnant and continues during the pregnancy is also called chronic hypertension. If you have chronic hypertension, it will not go away after you have your baby. You will need follow-up visits with your health care provider after you have your baby. Your health care provider may want you to keep taking medicine for your blood pressure.  Gestational hypertension. This is hypertension that develops after the 20th week of pregnancy. Gestational hypertension usually goes away after you have your baby, but your health care provider will need to monitor your blood pressure to make sure that it is getting better.  Postpartum hypertension. This is high blood pressure that was present before delivery and continues after delivery or that starts after delivery. This usually occurs within 48 hours after childbirth but may occur up to 6 weeks after giving birth. When hypertension during pregnancy is severe, it is a medical emergency that requires treatment right away. How does this affect me? Women who have hypertension during pregnancy have a greater chance of developing hypertension later in life or during future pregnancies. In some cases, hypertension during pregnancy can cause serious complications, such as:  Stroke.  Heart attack.  Injury to other organs, such as kidneys, lungs, or  liver.  Preeclampsia.  A condition called hemolysis, elevated liver enzymes, and low platelet count (HELLP) syndrome.  Convulsions or seizures.  Placental abruption. How does this affect my baby? Hypertension during pregnancy can affect your baby. Your baby may:  Be born early (prematurely).  Not weigh as much as he or she should at birth (low birth weight).  Not tolerate labor well, leading to an unplanned cesarean delivery. This condition may also result in a baby's death before birth (stillbirth). What are the risks? There are certain factors that make it more likely for you to develop hypertension during pregnancy. These include:  Having hypertension during a previous pregnancy or a family history of hypertension.  Being overweight.  Being age 35 or older.  Being pregnant for the first time.  Being pregnant with more than one baby.  Becoming pregnant using fertilization methods, such as IVF (in vitro fertilization).  Having other medical problems, such as diabetes, kidney disease, or lupus. What can I do to lower my risk? The exact cause of hypertension during pregnancy is not known. You may be able to lower your risk by:  Maintaining a healthy weight.  Eating a healthy and balanced diet.  Following your health care provider's instructions about treating any long-term conditions that you had before becoming pregnant. It is very important to keep all of your prenatal care appointments. Your health care provider will check your blood pressure and make sure that your pregnancy is progressing as expected. If a problem is found, early treatment can prevent complications.   How is this treated? Treatment for hypertension during pregnancy varies depending on the type of hypertension you have and how serious it is.  If you were   taking medicine for high blood pressure before you became pregnant, talk with your health care provider. You may need to change medicine during  pregnancy because some medicines, like ACE inhibitors, may not be considered safe for your baby.  If you have gestational hypertension, your health care provider may order medicine to treat this during pregnancy.  If you are at risk for preeclampsia, your health care provider may recommend that you take a low-dose aspirin during your pregnancy.  If you have severe hypertension, you may need to be hospitalized so you and your baby can be monitored closely. You may also need to be given medicine to lower your blood pressure.  In some cases, if your condition gets worse, you may need to deliver your baby early. Follow these instructions at home: Eating and drinking  Drink enough fluid to keep your urine pale yellow.  Avoid caffeine.   Lifestyle  Do not use any products that contain nicotine or tobacco. These products include cigarettes, chewing tobacco, and vaping devices, such as e-cigarettes. If you need help quitting, ask your health care provider.  Do not use alcohol or drugs.  Avoid stress as much as possible.  Rest and get plenty of sleep.  Regular exercise can help to reduce your blood pressure. Ask your health care provider what kinds of exercise are best for you. General instructions  Take over-the-counter and prescription medicines only as told by your health care provider.  Keep all prenatal and follow-up visits. This is important. Contact a health care provider if:  You have symptoms that your health care provider told you may require more treatment or monitoring, such as: ? Headaches. ? Nausea or vomiting. ? Abdominal pain. ? Dizziness. ? Light-headedness. Get help right away if:  You have symptoms of serious complications, such as: ? Severe abdominal pain that does not get better with treatment. ? A severe headache that does not get better, blurred vision, or double vision. ? Vomiting that does not get better. ? Sudden, rapid weight gain or swelling in your  hands, ankles, or face. ? Vaginal bleeding. ? Blood in your urine. ? Shortness of breath or chest pain. ? Weakness on one side of your body or difficulty speaking.  Your baby is not moving as much as usual. These symptoms may represent a serious problem that is an emergency. Do not wait to see if the symptoms will go away. Get medical help right away. Call your local emergency services (911 in the [place] Do not drive yourself to the hospital. Summary  Hypertension during pregnancy can cause problems for you and your baby.  Treatment for hypertension during pregnancy varies depending on the type of hypertension you have and how serious it is.  Keep all prenatal and follow-up visits. This is important.  Get help right away if you have symptoms of serious complications related to high blood pressure. This information is not intended to replace advice given to you by your health care provider. Make sure you discuss any questions you have with your health care provider. Document Revised: 09/10/2019 Document Reviewed: 09/10/2019 Elsevier Patient Education  2021 Elsevier Inc.  

## 2020-01-17 ENCOUNTER — Other Ambulatory Visit: Payer: Self-pay | Admitting: Obstetrics & Gynecology

## 2020-01-17 DIAGNOSIS — Z3A38 38 weeks gestation of pregnancy: Secondary | ICD-10-CM

## 2020-01-17 NOTE — Progress Notes (Signed)
OB History & Physical   History of Present Illness:  Chief Complaint:   HPI:  Brandi Higgins is a 30 y.o. G2P0010 female at [redacted]w[redacted]d dated by 9 week u/s.  She will present to L&D for IOL for GHTN  Pregnancy Issues: 1. GHTN 2. Hx of ectopic pregnancy in May 2021  3. Prepregnancy BMI 36 4. Transfer of care at [redacted]w[redacted]d 5. Gestational Diabetes dx 46wks  Maternal Medical History:   Past Medical History:  Diagnosis Date  . Abnormal Pap smear of vagina   . Gestational diabetes   . Heart murmur   . History of ectopic pregnancy   . Migraines   . Swelling     Past Surgical History:  Procedure Laterality Date  . SHOULDER SURGERY    . WISDOM TOOTH EXTRACTION    . WRIST SURGERY      No Known Allergies  Prior to Admission medications   Medication Sig Start Date End Date Taking? Authorizing Provider  insulin NPH Human (NOVOLIN N) 100 UNIT/ML injection Inject 20 Units into the skin at bedtime. Patient not taking: Reported on 01/14/2020 01/11/20 01/10/21  [provider]  Insulin Syringe-Needle U-100 (INSULIN SYRINGE 1CC/31GX5/16") 31G X 5/16" 1 ML MISC See admin instructions. Patient not taking: Reported on 01/14/2020 01/11/20 01/10/21  [provider]  pantoprazole (PROTONIX) 40 MG tablet Take 40 mg by mouth daily. 12/01/19 11/30/20  [provider]  Prenatal 27-1 MG TABS Take by mouth.    [provider]     Prenatal care site: [facility]    Social History: She  reports that she has never smoked. She has never used smokeless tobacco. She reports previous alcohol use. She reports that she does not use drugs.  Family History: family history includes Diabetes in her maternal grandmother and paternal grandmother.    Pertinent Results:  Prenatal Labs: Blood type/Rh A pos  Antibody screen neg  Rubella Immune  Varicella Immune  RPR NR  HBsAg Neg  HIV NR  GC neg  Chlamydia neg  Genetic screening negative  1 hour GTT 219  3 hour GTT    GBS neg   Results for orders placed or performed during the hospital encounter of 01/15/20 (from the past 72 hour(s))  CBC on admission     Status: Abnormal   Collection Time: 01/15/20 11:15 AM  Result Value Ref Range   WBC 11.2 (H) 4.0 - 10.5 K/uL   RBC 3.85 (L) 3.87 - 5.11 MIL/uL   Hemoglobin 11.4 (L) 12.0 - 15.0 g/dL   HCT 41.6 (L) 60.6 - 30.1 %   MCV 87.8 80.0 - 100.0 fL   MCH 29.6 26.0 - 34.0 pg   MCHC 33.7 30.0 - 36.0 g/dL   RDW 60.1 09.3 - 23.5 %   Platelets 220 150 - 400 K/uL   nRBC 0.0 0.0 - 0.2 %    Comment: Performed at [facility], [address] [place], [place] [address]  Comprehensive metabolic panel     Status: Abnormal   Collection Time: 01/15/20 11:15 AM  Result Value Ref Range   Sodium 137 135 - 145 mmol/L   Potassium 3.8 3.5 - 5.1 mmol/L   Chloride 110 98 - 111 mmol/L   CO2 19 (L) 22 - 32 mmol/L   Glucose, Bld 76 70 - 99 mg/dL    Comment: Glucose reference range applies only to samples taken after fasting for at least 8 hours.   BUN 11 6 - 20 mg/dL  Creatinine, Ser 0.68 0.44 - 1.00 mg/dL   Calcium 9.2 8.9 - 84.5 mg/dL   Total Protein 6.2 (L) 6.5 - 8.1 g/dL   Albumin 2.9 (L) 3.5 - 5.0 g/dL   AST 20 15 - 41 U/L   ALT 11 0 - 44 U/L   Alkaline Phosphatase 99 38 - 126 U/L   Total Bilirubin 0.7 0.3 - 1.2 mg/dL   GFR, Estimated >36 >46 mL/min    Comment: (NOTE) Calculated using the CKD-EPI Creatinine Equation (2021)    Anion gap 8 5 - 15    Comment: Performed at [facility], [address], [place], [place] [address]  Protein / creatinine ratio, urine     Status: None   Collection Time: 01/15/20 11:20 AM  Result Value Ref Range   Creatinine, Urine 21 mg/dL   Total Protein, Urine <6 mg/dL    Comment: NO NORMAL RANGE ESTABLISHED FOR THIS TEST   Protein Creatinine Ratio        0.00 - 0.15 mg/mg[Cre]    Comment: RESULT BELOW REPORTABLE RANGE, UNABLE TO CALCULATE. Performed at [facility], [address] [place],  [place] [address]     [place] Comp + 14 Wk  Result Date: 01/15/2020 CLINICAL DATA:  Gestational diabetes, hypertension EXAM: OBSTETRICAL ULTRASOUND >14 WKS FINDINGS: Number of Fetuses: 1 Heart Rate:  137 bpm Movement: Yes Presentation: Cephalic Previa: No Placental Location: Anterior Amniotic Fluid (Subjective): Normal Amniotic Fluid (Objective): AFI = 12.8 cm (5%ile= 7.5 cm, 95%= 24.4 cm for 37 wks) FETAL BIOMETRY BPD: 9.4cm 38w 3d HC:   33.0cm 37w 4d AC:   33.5cm 37w 3d FL:   7.3cm 37w 3d Current Mean GA: 37w 4d [place] EDC: 02/01/2020 Assigned GA:  37w 2d Assigned EDC: 02/03/2020 Estimated Fetal Weight:  3,243g 65%ile FETAL ANATOMY Lateral Ventricles: Appears normal Thalami/CSP: Appears normal Posterior Fossa:  Not visualized Nuchal Region: Not visualized Upper Lip: Appears normal Spine: Appears normal 4 Chamber Heart on Left: Appears normal LVOT: Appears normal RVOT: Appears normal Stomach on Left: Appears normal 3 Vessel Cord: Appears normal Cord Insertion site: Not visualized Kidneys: Appears normal Bladder: Appears normal Extremities: Not visualized Sex: Female Technically difficult due to: Fetal position and advanced gestational age Maternal Findings: Cervix:  Closed.  3.4 cm. IMPRESSION: 1. Single live intrauterine gestation in cephalic presentation. Assigned gestational age of [redacted] weeks 2 days. 2. Estimated fetal weight is in the 65th percentile. 3. Amniotic fluid index of 12.8 cm, within normal limits. 4. Limited fetal anatomic survey secondary to advanced gestational age, as above. Electronically Signed   By: Duanne Guess D.O.   On: 01/15/2020 14:58     Plan:  Post Partum Planning: - Infant feeding: Breastfeeding - Contraception: possibly OCPs - Tdap Given 11/13/19 - Flu got a work 11/21   [employer], CNM 01/17/2020 8:01 AM

## 2020-01-18 ENCOUNTER — Inpatient Hospital Stay: Payer: 59 | Admitting: Anesthesiology

## 2020-01-18 ENCOUNTER — Other Ambulatory Visit: Payer: Self-pay

## 2020-01-18 ENCOUNTER — Encounter: Payer: Self-pay | Admitting: Obstetrics and Gynecology

## 2020-01-18 ENCOUNTER — Inpatient Hospital Stay
Admission: EM | Admit: 2020-01-18 | Discharge: 2020-01-22 | DRG: 768 | Disposition: A | Discharge status: Home / Self Care | Payer: 59 | Attending: Obstetrics & Gynecology | Admitting: Obstetrics & Gynecology

## 2020-01-18 DIAGNOSIS — Z3A37 37 weeks gestation of pregnancy: Secondary | ICD-10-CM | POA: Diagnosis not present

## 2020-01-18 DIAGNOSIS — O133 Gestational [pregnancy-induced] hypertension without significant proteinuria, third trimester: Secondary | ICD-10-CM | POA: Diagnosis present

## 2020-01-18 DIAGNOSIS — O1404 Mild to moderate pre-eclampsia, complicating childbirth: Secondary | ICD-10-CM | POA: Diagnosis not present

## 2020-01-18 DIAGNOSIS — D62 Acute posthemorrhagic anemia: Secondary | ICD-10-CM | POA: Diagnosis not present

## 2020-01-18 DIAGNOSIS — O24424 Gestational diabetes mellitus in childbirth, insulin controlled: Secondary | ICD-10-CM | POA: Diagnosis present

## 2020-01-18 DIAGNOSIS — O9081 Anemia of the puerperium: Secondary | ICD-10-CM | POA: Diagnosis not present

## 2020-01-18 DIAGNOSIS — Z20822 Contact with and (suspected) exposure to covid-19: Secondary | ICD-10-CM | POA: Diagnosis present

## 2020-01-18 DIAGNOSIS — O139 Gestational [pregnancy-induced] hypertension without significant proteinuria, unspecified trimester: Secondary | ICD-10-CM | POA: Diagnosis present

## 2020-01-18 DIAGNOSIS — O134 Gestational [pregnancy-induced] hypertension without significant proteinuria, complicating childbirth: Secondary | ICD-10-CM | POA: Diagnosis present

## 2020-01-18 DIAGNOSIS — O1405 Mild to moderate pre-eclampsia, complicating the puerperium: Secondary | ICD-10-CM | POA: Diagnosis not present

## 2020-01-18 HISTORY — DX: Essential (primary) hypertension: I10

## 2020-01-18 LAB — RESP PANEL BY RT-PCR (FLU A&B, COVID) ARPGX2
Influenza A by PCR: NEGATIVE
Influenza B by PCR: NEGATIVE
SARS Coronavirus 2 by RT PCR: NEGATIVE

## 2020-01-18 LAB — COMPREHENSIVE METABOLIC PANEL
ALT: 11 U/L (ref 0–44)
AST: 21 U/L (ref 15–41)
Albumin: 3.2 g/dL — ABNORMAL LOW (ref 3.5–5.0)
Alkaline Phosphatase: 105 U/L (ref 38–126)
Anion gap: 8 (ref 5–15)
BUN: 11 mg/dL (ref 6–20)
CO2: 19 mmol/L — ABNORMAL LOW (ref 22–32)
Calcium: 9 mg/dL (ref 8.9–10.3)
Chloride: 109 mmol/L (ref 98–111)
Creatinine, Ser: 0.7 mg/dL (ref 0.44–1.00)
GFR, Estimated: >60 mL/min (ref >60)
Glucose, Bld: 79 mg/dL (ref 70–99)
Potassium: 3.8 mmol/L (ref 3.5–5.1)
Sodium: 136 mmol/L (ref 135–145)
Total Bilirubin: 0.4 mg/dL (ref 0.3–1.2)
Total Protein: 6.8 g/dL (ref 6.5–8.1)

## 2020-01-18 LAB — TYPE AND SCREEN
ABO/RH(D): A POS
Antibody Screen: NEGATIVE

## 2020-01-18 LAB — CBC
HCT: 35.9 % — ABNORMAL LOW (ref 36.0–46.0)
Hemoglobin: 12.4 g/dL (ref 12.0–15.0)
MCH: 30 pg (ref 26.0–34.0)
MCHC: 34.5 g/dL (ref 30.0–36.0)
MCV: 86.9 fL (ref 80.0–100.0)
Platelets: 231 10*3/uL (ref 150–400)
RBC: 4.13 MIL/uL (ref 3.87–5.11)
RDW: 13.9 % (ref 11.5–15.5)
WBC: 12.4 10*3/uL — ABNORMAL HIGH (ref 4.0–10.5)
nRBC: 0 % (ref 0.0–0.2)

## 2020-01-18 LAB — PROTEIN / CREATININE RATIO, URINE
Creatinine, Urine: 195 mg/dL
Protein Creatinine Ratio: 0.43 mg/mg{Cre} — ABNORMAL HIGH (ref 0.00–0.15)
Total Protein, Urine: 84 mg/dL

## 2020-01-18 MED ORDER — SOD CITRATE-CITRIC ACID 500-334 MG/5ML PO SOLN: 30.0000 mL | ORAL | Status: DC | PRN

## 2020-01-18 MED ORDER — ACETAMINOPHEN 325 MG PO TABS: 650.0000 mg | ORAL_TABLET | ORAL | Status: DC | PRN

## 2020-01-18 MED ORDER — PHENYLEPHRINE 40 MCG/ML (10ML) SYRINGE FOR IV PUSH (FOR BLOOD PRESSURE SUPPORT): 80.0000 ug | PREFILLED_SYRINGE | INTRAVENOUS | Status: DC | PRN

## 2020-01-18 MED ORDER — LACTATED RINGERS IV SOLN
500.0000 mL | Freq: Once | INTRAVENOUS | Status: AC
  Administered 2020-01-18: 500 mL via INTRAVENOUS

## 2020-01-18 MED ORDER — LACTATED RINGERS IV SOLN: INTRAVENOUS | Status: DC

## 2020-01-18 MED ORDER — OXYTOCIN BOLUS FROM INFUSION
333.0000 mL | Freq: Once | INTRAVENOUS | Status: AC
  Administered 2020-01-19: 333 mL via INTRAVENOUS

## 2020-01-18 MED ORDER — FENTANYL 2.5 MCG/ML W/ROPIVACAINE 0.15% IN NS 100 ML EPIDURAL (ARMC)
EPIDURAL | Status: DC | PRN
  Administered 2020-01-18: 12 mL/h via EPIDURAL

## 2020-01-18 MED ORDER — MISOPROSTOL 25 MCG QUARTER TABLET
25.0000 ug | ORAL_TABLET | ORAL | Status: DC | PRN
  Filled 2020-01-18 (×2): qty 1

## 2020-01-18 MED ORDER — OXYTOCIN 10 UNIT/ML IJ SOLN
INTRAMUSCULAR | Status: AC
  Filled 2020-01-18: qty 2

## 2020-01-18 MED ORDER — FENTANYL 2.5 MCG/ML W/ROPIVACAINE 0.15% IN NS 100 ML EPIDURAL (ARMC): 12.0000 mL/h | EPIDURAL | Status: DC

## 2020-01-18 MED ORDER — SODIUM CHLORIDE 0.9 % IV SOLN
INTRAVENOUS | Status: DC | PRN
  Administered 2020-01-18 (×3): 5 mL via EPIDURAL

## 2020-01-18 MED ORDER — LIDOCAINE HCL (PF) 1 % IJ SOLN
INTRAMUSCULAR | Status: AC
  Filled 2020-01-18: qty 30

## 2020-01-18 MED ORDER — LIDOCAINE HCL (PF) 1 % IJ SOLN: 30.0000 mL | INTRAMUSCULAR | Status: DC | PRN

## 2020-01-18 MED ORDER — LACTATED RINGERS IV SOLN
500.0000 mL | INTRAVENOUS | Status: DC | PRN
  Administered 2020-01-18: 250 mL via INTRAVENOUS

## 2020-01-18 MED ORDER — OXYCODONE-ACETAMINOPHEN 5-325 MG PO TABS: 1.0000 | ORAL_TABLET | ORAL | Status: DC | PRN

## 2020-01-18 MED ORDER — DIPHENHYDRAMINE HCL 50 MG/ML IJ SOLN: 12.5000 mg | INTRAMUSCULAR | Status: DC | PRN

## 2020-01-18 MED ORDER — AMMONIA AROMATIC IN INHA
RESPIRATORY_TRACT | Status: AC
  Filled 2020-01-18: qty 10

## 2020-01-18 MED ORDER — OXYCODONE-ACETAMINOPHEN 5-325 MG PO TABS: 2.0000 | ORAL_TABLET | ORAL | Status: DC | PRN

## 2020-01-18 MED ORDER — OXYTOCIN-SODIUM CHLORIDE 30-0.9 UT/500ML-% IV SOLN
INTRAVENOUS | Status: AC
  Filled 2020-01-18: qty 1000

## 2020-01-18 MED ORDER — EPHEDRINE 5 MG/ML INJ: 10.0000 mg | INTRAVENOUS | Status: DC | PRN

## 2020-01-18 MED ORDER — EPHEDRINE 5 MG/ML INJ
10.0000 mg | INTRAVENOUS | Status: DC | PRN
  Administered 2020-01-18: 10 mg via INTRAVENOUS
  Filled 2020-01-18: qty 4

## 2020-01-18 MED ORDER — LIDOCAINE HCL (PF) 1 % IJ SOLN
INTRAMUSCULAR | Status: DC | PRN
  Administered 2020-01-18 (×3): 1 mL

## 2020-01-18 MED ORDER — MISOPROSTOL 200 MCG PO TABS
ORAL_TABLET | ORAL | Status: AC
  Administered 2020-01-18: 25 ug via VAGINAL
  Filled 2020-01-18: qty 4

## 2020-01-18 MED ORDER — FENTANYL 2.5 MCG/ML W/ROPIVACAINE 0.15% IN NS 100 ML EPIDURAL (ARMC)
EPIDURAL | Status: AC
  Filled 2020-01-18: qty 100

## 2020-01-18 MED ORDER — OXYTOCIN-SODIUM CHLORIDE 30-0.9 UT/500ML-% IV SOLN
1.0000 m[IU]/min | INTRAVENOUS | Status: DC
  Administered 2020-01-18: 2 m[IU]/min via INTRAVENOUS
  Filled 2020-01-18: qty 500

## 2020-01-18 MED ORDER — ONDANSETRON HCL 4 MG/2ML IJ SOLN: 4.0000 mg | Freq: Four times a day (QID) | INTRAMUSCULAR | Status: DC | PRN

## 2020-01-18 MED ORDER — OXYTOCIN-SODIUM CHLORIDE 30-0.9 UT/500ML-% IV SOLN
2.5000 [IU]/h | INTRAVENOUS | Status: DC
  Administered 2020-01-19: 2.5 [IU]/h via INTRAVENOUS

## 2020-01-18 MED ORDER — TERBUTALINE SULFATE 1 MG/ML IJ SOLN: 0.2500 mg | Freq: Once | INTRAMUSCULAR | Status: DC | PRN

## 2020-01-18 MED ORDER — MISOPROSTOL 25 MCG QUARTER TABLET
25.0000 ug | ORAL_TABLET | ORAL | Status: DC | PRN
  Administered 2020-01-18: 25 ug via BUCCAL
  Filled 2020-01-18: qty 1

## 2020-01-18 NOTE — Progress Notes (Signed)
Cook Cath placed, cx soft 1-2/50/-2, midposition.   3ml uterine/39ml vaginal balloon inflated with sterile LR.   Pt tolerated well.   Randa Ngo, CNM 01/18/2020 4:34 PM

## 2020-01-18 NOTE — H&P (Signed)
OB History & Physical   History of Present Illness:  Chief Complaint: induction of labor for hypertension  HPI:  Brandi Higgins is a 30 y.o. G2P0010 female at [redacted]w[redacted]d dated by [place] at 9wks.  She presents to L&D for NST for [facility] and noted to have mild range BPs 140s/100s with 2+ LE edema.   Active FM, denies UCs, denies LOF or VB. Denies HA, VD or RUQ pain.    Pregnancy Issues: 1. GHTN 2. Hx of ectopic pregnancy in May 2021  3. Prepregnancy BMI 36 4. Transfer of care at [redacted]w[redacted]d 5. GDMA2, dx 28wks, on insulin   Maternal Medical History:   Past Medical History:  Diagnosis Date  . Abnormal Pap smear of vagina   . Gestational diabetes   . Heart murmur   . History of ectopic pregnancy   . Hypertension   . Migraines   . Swelling     Past Surgical History:  Procedure Laterality Date  . SHOULDER SURGERY    . WISDOM TOOTH EXTRACTION    . WRIST SURGERY      No Known Allergies  Prior to Admission medications   Medication Sig Start Date End Date Taking? Authorizing Provider  pantoprazole (PROTONIX) 40 MG tablet Take 40 mg by mouth daily. 12/01/19 11/30/20 Yes [provider]  Prenatal 27-1 MG TABS Take by mouth.   Yes [provider]  insulin NPH Human (NOVOLIN N) 100 UNIT/ML injection Inject 20 Units into the skin at bedtime. Patient not taking: No sig reported 01/11/20 01/10/21  [provider]  Insulin Syringe-Needle U-100 (INSULIN SYRINGE 1CC/31GX5/16") 31G X 5/16" 1 ML MISC See admin instructions. Patient not taking: No sig reported 01/11/20 01/10/21  [provider]     Prenatal care site: [facility]  Social History: She  reports that she has never smoked. She has never used smokeless tobacco. She reports previous alcohol use. She reports that she does not use drugs.  Family History: family history includes Diabetes in her maternal grandmother and paternal grandmother.   Review of Systems: A full review of systems was  performed and negative except as noted in the HPI.     Physical Exam:  Vital Signs: BP (!) 147/100 (BP Location: Left Arm)   Pulse 91   Resp 18   Ht 5\' 5"  (1.651 m)   Wt 108.9 kg   LMP  (LMP Unknown)   BMI 39.94 kg/m    General: no acute distress.  HEENT: normocephalic, atraumatic Heart: regular rate & rhythm.  No murmurs/rubs/gallops Lungs: clear to auscultation bilaterally, normal respiratory effort Abdomen: soft, gravid, non-tender;  EFW: 3243grams by growth done on 01/15/19 Pelvic:   External: Normal external female genitalia  Cervix: 1-2/50/-2, soft/midposition.   Extremities: non-tender, symmetric, 2+ LE edema bilaterally.  DTRs: 2+  Neurologic: Alert & oriented x 3.    No results found for this or any previous visit (from the past 24 hour(s)).  Pertinent Results:  Prenatal Labs: Blood type/Rh A pos  Antibody screen neg  Rubella Immune  Varicella Immune  RPR NR  HBsAg Neg  HIV NR  GC neg  Chlamydia neg  Genetic screening negative  1 hour GTT  219  3 hour GTT  n/a  GBS  Neg   FHT: 150bpm, + accels, mod varaibility, no decels TOCO: Irreg occasional UCs.     Cephalic by leopolds  11-01-1991 OB Comp + 14 Wk  Result Date: 01/15/2020 CLINICAL DATA:  Gestational diabetes, hypertension EXAM:  OBSTETRICAL ULTRASOUND >14 WKS FINDINGS: Number of Fetuses: 1 Heart Rate:  137 bpm Movement: Yes Presentation: Cephalic Previa: No Placental Location: Anterior Amniotic Fluid (Subjective): Normal Amniotic Fluid (Objective): AFI = 12.8 cm (5%ile= 7.5 cm, 95%= 24.4 cm for 37 wks) FETAL BIOMETRY BPD: 9.4cm 38w 3d HC:   33.0cm 37w 4d AC:   33.5cm 37w 3d FL:   7.3cm 37w 3d Current Mean GA: 37w 4d [place] EDC: 02/01/2020 Assigned GA:  37w 2d Assigned EDC: 02/03/2020 Estimated Fetal Weight:  3,243g 65%ile FETAL ANATOMY Lateral Ventricles: Appears normal Thalami/CSP: Appears normal Posterior Fossa:  Not visualized Nuchal Region: Not visualized Upper Lip: Appears normal Spine: Appears normal 4  Chamber Heart on Left: Appears normal LVOT: Appears normal RVOT: Appears normal Stomach on Left: Appears normal 3 Vessel Cord: Appears normal Cord Insertion site: Not visualized Kidneys: Appears normal Bladder: Appears normal Extremities: Not visualized Sex: Female Technically difficult due to: Fetal position and advanced gestational age Maternal Findings: Cervix:  Closed.  3.4 cm. IMPRESSION: 1. Single live intrauterine gestation in cephalic presentation. Assigned gestational age of [redacted] weeks 2 days. 2. Estimated fetal weight is in the 65th percentile. 3. Amniotic fluid index of 12.8 cm, within normal limits. 4. Limited fetal anatomic survey secondary to advanced gestational age, as above. Electronically Signed   By: Duanne Guess D.O.   On: 01/15/2020 14:58    Assessment:  Brandi Higgins is a 30 y.o. G2P0010 female at [redacted]w[redacted]d with GHTN.   Plan:  1. Admit to Labor & Delivery; consents reviewed and obtained - COVID swab on admit.  - CMP and P/C ratio on admit.  - reviewed dx GHTN with indication for IOL, d/w Dr Dalbert Garnet.   2. Fetal Well being  - Fetal Tracing: Cat I - Group B Streptococcus ppx indicated: negative - Presentation: cephaclic confirmed by exam and Leopolds   3. Routine OB: - Prenatal labs reviewed, as above - Rh A Pos - CBC, T&S, RPR on admit - Clear fluids, IVF  4. Induction of Labor -  Contractions: external toco in place -  Pelvis adequate for TOL -  Plan for induction with cytotec, [facility] cath.  -  Plan for continuous fetal monitoring  -  Maternal pain control as desired - Anticipate vaginal delivery  5. Post Partum Planning: - Infant feeding: Breastfeeding - Contraception: possibly OCPs - Tdap Given 11/13/19 - Flu got a work 11/21   Randa Ngo, CNM 01/18/20 3:45 PM

## 2020-01-18 NOTE — Progress Notes (Signed)
Labor Progress Note  Brandi Higgins is a 30 y.o. G2P0010 at [redacted]w[redacted]d by ultrasound admitted for induction of labor due to Pre-eclampsia without severe features.   Subjective: comfortable with epidural  Objective: BP 121/78   Pulse 78   Temp 98 F (36.7 C) (Oral)   Resp 18   Ht 5\' 5"  (1.651 m)   Wt 108.9 kg   LMP  (LMP Unknown)   SpO2 99%   BMI 39.94 kg/m  Notable VS details: reviewed- BP significantly lower than baseline 140/90s  Fetal Assessment: FHT:  prlonged decel to 90bpm, scalp stim and multiple position changes. IUPC and FSE, amnioinfusion started. Now having early decels with moderate to marked variability.  Category/reactivity:  Category II UC:   regular, every 1-3 minutes, Pitocin DC'd. (only at 24mu/min previously.  SVE:   8/90/-1, station to 0 with UC.  Membrane status: AROM at 2156 Amniotic color:  Copious amount clear fluid with bloody show.   Labs: Lab Results  Component Value Date   WBC 12.4 (H) 01/18/2020   HGB 12.4 01/18/2020   HCT 35.9 (L) 01/18/2020   MCV 86.9 01/18/2020   PLT 231 01/18/2020    Assessment / Plan: IOL at 37.5wks with Pre-eclampsia, without severe features. Cat II tracing  Labor: progressing well.  Preeclampsia:  no signs or symptoms of toxicity, intake and ouput balanced and labs stable Fetal Wellbeing:  Category II Pain Control:  Epidural I/D:  n/a Anticipated MOD:  NSVD  01/20/2020 Jonae Renshaw, CNM 01/18/2020, 11:21 PM

## 2020-01-18 NOTE — OB Triage Note (Signed)
Here for a NST. Brandi Higgins

## 2020-01-18 NOTE — Anesthesia Preprocedure Evaluation (Signed)
Anesthesia Evaluation  Patient identified by MRN, date of birth, ID band Patient awake    Reviewed: Allergy & Precautions, H&P , NPO status , Patient's Chart, lab work & pertinent test results  Airway Mallampati: III  TM Distance: <3 FB     Dental  (+) Teeth Intact   Pulmonary neg pulmonary ROS,           Cardiovascular Exercise Tolerance: Good hypertension (gestational),      Neuro/Psych  Headaches,    GI/Hepatic negative GI ROS,   Endo/Other  diabetes (diet controlled)  Renal/GU   negative genitourinary   Musculoskeletal   Abdominal   Peds  Hematology negative hematology ROS (+)   Anesthesia Other Findings Past Medical History: No date: Abnormal Pap smear of vagina No date: Gestational diabetes No date: Heart murmur No date: History of ectopic pregnancy No date: Hypertension No date: Migraines No date: Swelling  Past Surgical History: No date: SHOULDER SURGERY No date: WISDOM TOOTH EXTRACTION No date: WRIST SURGERY  BMI    Body Mass Index: 39.94 kg/m      Reproductive/Obstetrics (+) Pregnancy                             Anesthesia Physical Anesthesia Plan  ASA: II  Anesthesia Plan: Epidural   Post-op Pain Management:    Induction:   PONV Risk Score and Plan:   Airway Management Planned:   Additional Equipment:   Intra-op Plan:   Post-operative Plan:   Informed Consent: I have reviewed the patients History and Physical, chart, labs and discussed the procedure including the risks, benefits and alternatives for the proposed anesthesia with the patient or authorized representative who has indicated his/her understanding and acceptance.       Plan Discussed with: Anesthesiologist  Anesthesia Plan Comments:         Anesthesia Quick Evaluation

## 2020-01-18 NOTE — Anesthesia Procedure Notes (Signed)
Epidural Patient location during procedure: OB Start time: 01/18/2020 8:44 PM End time: 01/18/2020 9:00 PM  Staffing Anesthesiologist: Karleen Hampshire, MD Performed: anesthesiologist   Preanesthetic Checklist Completed: patient identified, IV checked, site marked, risks and benefits discussed, surgical consent, monitors and equipment checked, pre-op evaluation and timeout performed  Epidural Patient position: sitting Prep: ChloraPrep Patient monitoring: heart rate, continuous pulse ox and blood pressure Approach: midline Location: L4-L5 Injection technique: LOR saline  Needle:  Needle type: Tuohy  Needle gauge: 18 G Needle length: 9 cm and 9 Needle insertion depth: 6 cm Catheter type: closed end flexible Catheter size: 20 Guage Catheter at skin depth: 10 cm Test dose: negative and Other  Assessment Events: blood not aspirated, injection not painful, no injection resistance, no paresthesia and negative IV test  Additional Notes Risks and benefits of procedure discussed with patient.  Risks including but not limited to infection, spinal/epidural hematoma, nerve injury, post dural puncture headache, chronic back pain, and inadequate/failed block.  Patient expressed understanding and consented to epidural placement. Negative dural puncture.  Negative aspiration.  Negative paresthesia on injection.  Dose given in divided aliquots.  Patient tolerated the procedure well with no immediate complications.   Reason for block:procedure for pain

## 2020-01-19 ENCOUNTER — Encounter: Payer: Self-pay | Admitting: Obstetrics & Gynecology

## 2020-01-19 LAB — RPR: RPR Ser Ql: NONREACTIVE

## 2020-01-19 LAB — GLUCOSE, CAPILLARY: Glucose-Capillary: 87 mg/dL (ref 70–99)

## 2020-01-19 MED ORDER — SODIUM CHLORIDE 0.9 % IV SOLN: 250.0000 mL | INTRAVENOUS | Status: DC | PRN

## 2020-01-19 MED ORDER — WITCH HAZEL-GLYCERIN EX PADS
1.0000 "application " | MEDICATED_PAD | CUTANEOUS | Status: DC | PRN
  Administered 2020-01-19 – 2020-01-20 (×2): 1 via TOPICAL
  Filled 2020-01-19 (×2): qty 100

## 2020-01-19 MED ORDER — BISACODYL 10 MG RE SUPP
10.0000 mg | Freq: Every day | RECTAL | Status: DC | PRN
  Filled 2020-01-19: qty 1

## 2020-01-19 MED ORDER — PRENATAL MULTIVITAMIN CH
1.0000 | ORAL_TABLET | Freq: Every day | ORAL | Status: DC
  Administered 2020-01-19 – 2020-01-21 (×3): 1 via ORAL
  Filled 2020-01-19 (×3): qty 1

## 2020-01-19 MED ORDER — BENZOCAINE-MENTHOL 20-0.5 % EX AERO
1.0000 | INHALATION_SPRAY | CUTANEOUS | Status: DC | PRN
Start: 2020-01-19 — End: 2020-01-22
  Administered 2020-01-19 – 2020-01-20 (×3): 1 via TOPICAL
  Filled 2020-01-19 (×2): qty 56

## 2020-01-19 MED ORDER — SIMETHICONE 80 MG PO CHEW: 80.0000 mg | CHEWABLE_TABLET | ORAL | Status: DC | PRN

## 2020-01-19 MED ORDER — DIBUCAINE (PERIANAL) 1 % EX OINT
1.0000 "application " | TOPICAL_OINTMENT | CUTANEOUS | Status: DC | PRN
  Administered 2020-01-19 – 2020-01-20 (×2): 1 via RECTAL
  Filled 2020-01-19 (×2): qty 28

## 2020-01-19 MED ORDER — ACETAMINOPHEN 325 MG PO TABS: 650.0000 mg | ORAL_TABLET | ORAL | Status: DC | PRN

## 2020-01-19 MED ORDER — SODIUM CHLORIDE 0.9% FLUSH
3.0000 mL | Freq: Two times a day (BID) | INTRAVENOUS | Status: DC
  Administered 2020-01-19 – 2020-01-21 (×5): 3 mL via INTRAVENOUS

## 2020-01-19 MED ORDER — OXYCODONE HCL 5 MG PO TABS: 5.0000 mg | ORAL_TABLET | ORAL | Status: DC | PRN

## 2020-01-19 MED ORDER — SENNOSIDES-DOCUSATE SODIUM 8.6-50 MG PO TABS
2.0000 | ORAL_TABLET | ORAL | Status: DC
  Administered 2020-01-19 – 2020-01-22 (×4): 2 via ORAL
  Filled 2020-01-19 (×4): qty 2

## 2020-01-19 MED ORDER — COCONUT OIL OIL
1.0000 "application " | TOPICAL_OIL | Status: DC | PRN
  Administered 2020-01-21: 1 via TOPICAL
  Filled 2020-01-19: qty 120

## 2020-01-19 MED ORDER — PANTOPRAZOLE SODIUM 40 MG PO TBEC
40.0000 mg | DELAYED_RELEASE_TABLET | Freq: Every day | ORAL | Status: DC
  Administered 2020-01-19 – 2020-01-22 (×2): 40 mg via ORAL
  Filled 2020-01-19 (×3): qty 1

## 2020-01-19 MED ORDER — TETANUS-DIPHTH-ACELL PERTUSSIS 5-2.5-18.5 LF-MCG/0.5 IM SUSY
0.5000 mL | PREFILLED_SYRINGE | Freq: Once | INTRAMUSCULAR | Status: DC
  Filled 2020-01-19: qty 0.5

## 2020-01-19 MED ORDER — IBUPROFEN 600 MG PO TABS
600.0000 mg | ORAL_TABLET | Freq: Four times a day (QID) | ORAL | Status: DC
  Administered 2020-01-19 – 2020-01-22 (×12): 600 mg via ORAL
  Filled 2020-01-19 (×12): qty 1

## 2020-01-19 MED ORDER — DIPHENHYDRAMINE HCL 25 MG PO CAPS: 25.0000 mg | ORAL_CAPSULE | Freq: Four times a day (QID) | ORAL | Status: DC | PRN

## 2020-01-19 MED ORDER — MEASLES, MUMPS & RUBELLA VAC IJ SOLR
0.5000 mL | Freq: Once | INTRAMUSCULAR | Status: DC
  Filled 2020-01-19: qty 0.5

## 2020-01-19 MED ORDER — ONDANSETRON HCL 4 MG/2ML IJ SOLN: 4.0000 mg | INTRAMUSCULAR | Status: DC | PRN

## 2020-01-19 MED ORDER — SODIUM CHLORIDE 0.9% FLUSH: 3.0000 mL | INTRAVENOUS | Status: DC | PRN

## 2020-01-19 MED ORDER — ZOLPIDEM TARTRATE 5 MG PO TABS: 5.0000 mg | ORAL_TABLET | Freq: Every evening | ORAL | Status: DC | PRN

## 2020-01-19 MED ORDER — FLEET ENEMA 7-19 GM/118ML RE ENEM: 1.0000 | ENEMA | Freq: Every day | RECTAL | Status: DC | PRN

## 2020-01-19 MED ORDER — ONDANSETRON HCL 4 MG PO TABS
4.0000 mg | ORAL_TABLET | ORAL | Status: DC | PRN
  Filled 2020-01-19: qty 1

## 2020-01-19 NOTE — Progress Notes (Signed)
Labor Progress Note  Brandi Higgins is a 30 y.o. G2P0010 at [redacted]w[redacted]d by ultrasound admitted for induction of labor due to Pre-eclampsia without severe features.   Subjective: pushing, feeling more pressure.   Objective: BP 140/86   Pulse 79   Temp 97.9 F (36.6 C) (Oral)   Resp 18   Ht 5\' 5"  (1.651 m)   Wt 108.9 kg   LMP  (LMP Unknown)   SpO2 100%   BMI 39.94 kg/m  Notable VS details: reviewed  Fetal Assessment: FHT:  160bpm, min-mod variability, occasional 10*10accel, variable decels with UCs.  Category/reactivity:  Category II UC:   regular, every 2-3 minutes, Pitocin currently at 34mu/min SVE:  C/C/+3, some caput noted.  Membrane status: AROM at 2156 Amniotic color:  Copious amount clear fluid with bloody show.   Labs: Lab Results  Component Value Date   WBC 12.4 (H) 01/18/2020   HGB 12.4 01/18/2020   HCT 35.9 (L) 01/18/2020   MCV 86.9 01/18/2020   PLT 231 01/18/2020   Component     Latest Ref Rng & Units 01/18/2020  Sodium     135 - 145 mmol/L 136  Potassium     3.5 - 5.1 mmol/L 3.8  Chloride     98 - 111 mmol/L 109  CO2     22 - 32 mmol/L 19 (L)  Glucose     70 - 99 mg/dL 79  BUN     6 - 20 mg/dL 11  Creatinine     01/20/2020 - 1.00 mg/dL 8.18  Calcium     8.9 - 10.3 mg/dL 9.0  Total Protein     6.5 - 8.1 g/dL 6.8  Albumin     3.5 - 5.0 g/dL 3.2 (L)  AST     15 - 41 U/L 21  ALT     0 - 44 U/L 11  Alkaline Phosphatase     38 - 126 U/L 105  Total Bilirubin     0.3 - 1.2 mg/dL 0.4  GFR, Estimated     >60 mL/min >60  Anion gap     5 - 15 8  Creatinine, Urine     mg/dL 2.99  Total Protein, Urine     mg/dL 84  Protein Creatinine Ratio     0.00 - 0.15 mg/mgCre 0.43 (H)    Assessment / Plan: IOL at 37.6wks with Pre-eclampsia, without severe features. Cat II tracing  Labor: progressing well.  pushing well, steady progress.  Preeclampsia:  no signs or symptoms of toxicity, intake and ouput balanced and labs stable Fetal Wellbeing:  Category  II Pain Control:  Epidural I/D:  n/a Anticipated MOD:  NSVD  371 Toddrick Sanna, CNM 01/19/2020, 2:29 AM

## 2020-01-19 NOTE — Discharge Summary (Signed)
Obstetrical Discharge Summary  Patient Name: Brandi Higgins DOB: [birth year] MRN: [id number]  Date of Admission: 01/18/2020 Date of Delivery: 01/19/20 Delivered by: Heloise Ochoa CNM Date of Discharge: 01/22/2020  Primary OB: [facility]  LMP:No LMP recorded (lmp unknown). EDC Estimated Date of Delivery: 02/03/20 Gestational Age at Delivery: [redacted]w[redacted]d   Antepartum complications:  1.GHTN 2.Hx of ectopic pregnancy in May 2021 3.Prepregnancy BMI 36 4.Transfer of care at [redacted]w[redacted]d 5.GDMA2, dx 28wks, on insulin  Admitting Diagnosis: Preeclampsia without severe features, 37.6wks Secondary Diagnosis: SVD, 3rd deg perineal lac  Patient Active Problem List   Diagnosis Date Noted  . Gestational hypertension w/o significant proteinuria in 3rd trimester 01/18/2020  . Gestational hypertension 01/18/2020  . Elevated blood pressure complicating pregnancy in third trimester, antepartum 01/15/2020  . Supervision of other normal pregnancy, antepartum 07/22/2019  . 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 Migraine without aura and without status migrainosus, not intractable 01/04/2014    Augmentation: AROM, Pitocin, Cytotec and IP Foley Complications: None Intrapartum complications/course: see delivery note and procedure note.  Date of Delivery: 01/19/20 Delivered By: Heloise Ochoa CNM Delivery Type: spontaneous vaginal delivery Anesthesia: epidural Placenta: spontaneous Laceration: 3B laceration Episiotomy: none QBL:  Newborn Data: Live born female "Maysen" Birth Weight:  7lb 12.9oz, 3540g APGAR: 6, 9  Newborn Delivery   Birth date/time: 01/19/2020 03:08:00 Delivery type: Vaginal, Spontaneous      Postpartum Procedures: None Edinburgh:  Edinburgh Postnatal Depression Scale Screening Tool 01/20/2020 01/19/2020  I have been able to laugh and see the funny side of things. 0 (No Data)  I have looked forward with enjoyment to things. 0 -  I have  blamed myself unnecessarily when things went wrong. 0 -  I have been anxious or worried for no good reason. 0 -  I have felt scared or panicky for no good reason. 0 -  Things have been getting on top of me. 0 -  I have been so unhappy that I have had difficulty sleeping. 0 -  I have felt sad or miserable. 0 -  I have been so unhappy that I have been crying. 0 -  The thought of harming myself has occurred to me. 0 -  Edinburgh Postnatal Depression Scale Total 0 -      Post partum course:  Patient had an uncomplicated postpartum course other than elevated BP. Initially started on Procardia 30mg  daily on PPD 1, then increased to 60mg  XL and HCTZ on PPD2, with minimal improvement. Later on PPD2, was started on Labetalol 400mg  PO BID and stopped procardia with improved BP.   By time of discharge on PPD#3, her pain was controlled on oral pain medications; she had appropriate lochia and was ambulating, voiding without difficulty and tolerating regular diet.  She was deemed stable for discharge to home.      Discharge Physical Exam:  BP (!) 130/91 (BP Location: Left Arm) Comment: nurse notified  Pulse 93   Temp 97.9 F (36.6 C) (Oral)   Resp 20   Ht 5\' 5"  (1.651 m)   Wt 108.9 kg   LMP  (LMP Unknown)   SpO2 100%   Breastfeeding Unknown   BMI 39.94 kg/m   General: NAD CV: RRR Pulm: CTABL, nl effort ABD: s/nd/nt, fundus firm and below the umbilicus Lochia: moderate Perineum: well approximated/repair well approximated  DVT Evaluation: LE non-ttp, no evidence of DVT on exam.  Hemoglobin  Date Value Ref Range Status  01/20/2020 10.3 (  L) 12.0 - 15.0 g/dL Final  58/52/7782 42.3 11.1 - 15.9 g/dL Final   HCT  Date Value Ref Range Status  01/20/2020 30.7 (L) 36.0 - 46.0 % Final   Hematocrit  Date Value Ref Range Status  07/22/2019 39.6 34.0 - 46.6 % Final     Disposition: stable, discharge to home. Baby Feeding: formula Baby Disposition: home with mom  Rh Immune globulin  given: Rh pos Rubella vaccine given: immune Varicella vaccine given: immune Tdap vaccine given in AP or PP setting: 11/13/19 Flu vaccine given in AP or PP setting: 11/2019 at work  Contraception: TBD  Prenatal Labs:  Blood type/Rh A pos  Antibody screen neg  Rubella Immune  Varicella Immune  RPR NR  HBsAg Neg  HIV NR  GC neg  Chlamydia neg  Genetic screening negative  1 hour GTT  219  3 hour GTT  n/a  GBS  Neg      Plan:  Brandi Higgins was discharged to home in good condition. Follow-up appointment with [facility] in 3-5 days for b/p check and with delivering provider in 2 weeks.  Discharge Medications: Allergies as of 01/22/2020   No Known Allergies     Medication List    STOP taking these medications   insulin NPH Human 100 UNIT/ML injection Commonly known as: NOVOLIN N   INSULIN SYRINGE 1CC/31GX5/16" 31G X 5/16" 1 ML Misc     TAKE these medications   acetaminophen 325 MG tablet Commonly known as: Tylenol Take 2 tablets (650 mg total) by mouth every 4 (four) hours as needed (for pain scale < 4).   hydrochlorothiazide 25 MG tablet Commonly known as: HYDRODIURIL Take 1 tablet (25 mg total) by mouth daily.   ibuprofen 600 MG tablet Commonly known as: ADVIL Take 1 tablet (600 mg total) by mouth every 6 (six) hours as needed for mild pain or moderate pain.   labetalol 200 MG tablet Commonly known as: NORMODYNE Take 2 tablets (400 mg total) by mouth 2 (two) times daily.   pantoprazole 40 MG tablet Commonly known as: PROTONIX Take 40 mg by mouth daily.   Prenatal 27-1 MG Tabs Take by mouth.        Follow-up Information    [facility]. Schedule an appointment as soon as possible for a visit on 01/26/2020.   Why: for blood pressure check.  May be televideo visit on Tuesday or Wednesday.  Contact information: [address] [place] [address] [phone]       McVey, Prudencio Pair, CNM. Schedule an appointment as  soon as possible for a visit in 2 week(s).   Specialty: Obstetrics and Gynecology Why: postpartum visit  Contact information: [address] [place] [address] [phone]               Signed:  Al Corpus 01/22/2020  8:49 AM  Margaretmary Eddy, CNM Certified Nurse Midwife [facility]

## 2020-01-19 NOTE — Procedures (Signed)
Procedure - Third deg perineal laceration repair  S/p delivery of baby girl, by CNM  Preop Dx:  1) Intrauterine pregnancy at 37+6, nuchal arm at vaginal delivery 2) Postplacenta laceration repair  Postop Dx:  1) Intrauterine pregnancy at 37+6 2) Attainment of second stage  3) 3b lac laceration  Procedure: Third degree perineal repair  Attending Surgeon: Christeen Douglas, MD MPH Est blood loss: min  Findings: capsule of the external sphincter >50%  Complications: none  Disposition: Mother-Baby Unit  Anesthesia: epidural  DESCRIPTION OF THE PROCEDURE:   Evaluation of the perineum noted a 3b third degree laceration, with the external anal sphincter >50% torn  The external sphincter was grasped with two long Allis clamps and brought to the midline. It was closed in an end-to-end fashion with 2-0 Vicryl in multiple interrupted stiches x4, around the circumference of the muscle. It came together without excess tissues tension.  The rectovaginal septum and perineal body were brought together in running layers, adding in a crown stitch to bring the deep edges of the bulbocavernosus together in the midline.   The vaginal mucosa was repaired with 2-0 Vicryl Rapide in a running locked fashion, and the perineal edges were brought together in the midline in a subcuticular fashion.The last knot was buried behind the hymen.   The patient tolerated the procedure well and is stable in the labor and delivery room.

## 2020-01-19 NOTE — Progress Notes (Signed)
Labor Progress Note  Brandi Higgins is a 30 y.o. G2P0010 at [redacted]w[redacted]d by ultrasound admitted for induction of labor due to Pre-eclampsia without severe features.   Subjective: comfortable with epidural, feeling some pressure now.   Objective: BP 125/77   Pulse 81   Temp 98 F (36.7 C) (Oral)   Resp 18   Ht 5\' 5"  (1.651 m)   Wt 108.9 kg   LMP  (LMP Unknown)   SpO2 99%   BMI 39.94 kg/m  Notable VS details: reviewed  Fetal Assessment: FHT:  150bpm, min-mod variability, occasional 10*10accel, variable and late decels intermittently.  Category/reactivity:  Category II UC:   regular, every 1-3 minutes, Pitocin remains off.   SVE:  C/C/+1 Membrane status: AROM at 2156 Amniotic color:  Copious amount clear fluid with bloody show.   Labs: Lab Results  Component Value Date   WBC 12.4 (H) 01/18/2020   HGB 12.4 01/18/2020   HCT 35.9 (L) 01/18/2020   MCV 86.9 01/18/2020   PLT 231 01/18/2020   Component     Latest Ref Rng & Units 01/18/2020  Sodium     135 - 145 mmol/L 136  Potassium     3.5 - 5.1 mmol/L 3.8  Chloride     98 - 111 mmol/L 109  CO2     22 - 32 mmol/L 19 (L)  Glucose     70 - 99 mg/dL 79  BUN     6 - 20 mg/dL 11  Creatinine     01/20/2020 - 1.00 mg/dL 5.20  Calcium     8.9 - 10.3 mg/dL 9.0  Total Protein     6.5 - 8.1 g/dL 6.8  Albumin     3.5 - 5.0 g/dL 3.2 (L)  AST     15 - 41 U/L 21  ALT     0 - 44 U/L 11  Alkaline Phosphatase     38 - 126 U/L 105  Total Bilirubin     0.3 - 1.2 mg/dL 0.4  GFR, Estimated     >60 mL/min >60  Anion gap     5 - 15 8  Creatinine, Urine     mg/dL 8.02  Total Protein, Urine     mg/dL 84  Protein Creatinine Ratio     0.00 - 0.15 mg/mgCre 0.43 (H)    Assessment / Plan: IOL at 37.6wks with Pre-eclampsia, without severe features. Cat II tracing  Labor: progressing well. now second stage, will begin pushing.  Preeclampsia:  no signs or symptoms of toxicity, intake and ouput balanced and labs stable Fetal Wellbeing:   Category II Pain Control:  Epidural I/D:  n/a Anticipated MOD:  NSVD  233 Elan Mcelvain, CNM 01/19/2020, 12:34 AM

## 2020-01-20 ENCOUNTER — Encounter: Payer: Self-pay | Admitting: Obstetrics & Gynecology

## 2020-01-20 LAB — CBC
HCT: 30.7 % — ABNORMAL LOW (ref 36.0–46.0)
Hemoglobin: 10.3 g/dL — ABNORMAL LOW (ref 12.0–15.0)
MCH: 29.9 pg (ref 26.0–34.0)
MCHC: 33.6 g/dL (ref 30.0–36.0)
MCV: 89.2 fL (ref 80.0–100.0)
Platelets: 169 10*3/uL (ref 150–400)
RBC: 3.44 MIL/uL — ABNORMAL LOW (ref 3.87–5.11)
RDW: 14.5 % (ref 11.5–15.5)
WBC: 12.7 10*3/uL — ABNORMAL HIGH (ref 4.0–10.5)
nRBC: 0 % (ref 0.0–0.2)

## 2020-01-20 LAB — SURGICAL PATHOLOGY

## 2020-01-20 MED ORDER — NIFEDIPINE ER OSMOTIC RELEASE 30 MG PO TB24
30.0000 mg | ORAL_TABLET | Freq: Every day | ORAL | Status: DC
  Administered 2020-01-20 – 2020-01-21 (×3): 30 mg via ORAL
  Filled 2020-01-20 (×3): qty 1

## 2020-01-20 MED ORDER — FERROUS SULFATE 325 (65 FE) MG PO TABS
325.0000 mg | ORAL_TABLET | Freq: Two times a day (BID) | ORAL | Status: DC
  Administered 2020-01-20 – 2020-01-22 (×4): 325 mg via ORAL
  Filled 2020-01-20 (×4): qty 1

## 2020-01-20 NOTE — Progress Notes (Signed)
Post Partum Day 1  Subjective: no complaints, up ad lib, voiding and tolerating PO  Doing well, no concerns. Ambulating without difficulty, pain managed with PO meds, tolerating regular diet, and voiding without difficulty.   No fever/chills, chest pain, shortness of breath, nausea/vomiting, or leg pain. No nipple or breast pain. No headache, visual changes, or RUQ/epigastric pain.  Objective: BP (!) 139/96 (BP Location: Right Arm) Comment: notify Abby, RN  Pulse (!) 106   Temp 98.6 F (37 C) (Oral)   Resp 18   Ht 5\' 5"  (1.651 m)   Wt 108.9 kg   LMP  (LMP Unknown)   SpO2 98%   Breastfeeding Unknown   BMI 39.94 kg/m  Blood pressures: 147/101, 153/101, 148/104   Physical Exam:  General: alert, cooperative and no distress Breasts: soft/nontender CV: RRR Pulm: nl effort, CTABL Abdomen: soft, non-tender, active bowel sounds Extremities: 1+ to 2+ pitting edema present  Uterine Fundus: firm Perineum: minimal edema, repair well approximated Lochia: appropriate DVT Evaluation: No evidence of DVT seen on physical exam.  Recent Labs    01/18/20 1543 01/20/20 0703  HGB 12.4 10.3*  HCT 35.9* 30.7*  WBC 12.4* 12.7*  PLT 231 169    Assessment/Plan: 30 y.o. G2P1011 postpartum day # 1  -Continue routine postpartum care -Blood pressures remain elevated today, no severe ranges  -Discussed with Dr. 01/22/20.  Will start on Procardia 30mg  today.   -Encouraged snug fitting bra, cold application, Tylenol PRN, and cabbage leaves for engorgement for formula feeding  -Acute blood loss anemia - hemodynamically stable and asymptomatic; start PO ferrous sulfate BID with stool softeners   Disposition: Continue inpatient postpartum care    LOS: 2 days   Elesa Massed, CNM 01/20/2020, 8:56 AM   ----- Gustavo Lah  Certified Nurse Midwife [facility]

## 2020-01-20 NOTE — Anesthesia Postprocedure Evaluation (Signed)
Anesthesia Post Note  Patient: Brandi Higgins  Procedure(s) Performed: AN AD HOC LABOR EPIDURAL  Anesthesia Type: Epidural   No complications documented.      Last Vitals:  Vitals:   01/20/20 0327 01/20/20 0740  BP: (!) 148/104 (!) 139/96  Pulse: (!) 103 (!) 106  Resp: 18 18  Temp: 36.8 C 37 C  SpO2: 99% 98%    Last Pain:  Vitals:   01/20/20 0740  TempSrc: Oral  PainSc:               No complications noted per chart review.  Patient was discharged before being being seen by anesthesia for post op visit.   KR    Karleen Hampshire

## 2020-01-21 ENCOUNTER — Other Ambulatory Visit: Payer: Self-pay | Admitting: Obstetrics and Gynecology

## 2020-01-21 MED ORDER — LABETALOL HCL 200 MG PO TABS
400.0000 mg | ORAL_TABLET | Freq: Two times a day (BID) | ORAL | Status: DC
  Administered 2020-01-21 – 2020-01-22 (×2): 400 mg via ORAL
  Filled 2020-01-21 (×3): qty 2

## 2020-01-21 MED ORDER — NIFEDIPINE ER OSMOTIC RELEASE 30 MG PO TB24: 30.0000 mg | ORAL_TABLET | Freq: Every day | ORAL | Status: DC

## 2020-01-21 MED ORDER — HYDROCHLOROTHIAZIDE 25 MG PO TABS: 25.0000 mg | ORAL_TABLET | Freq: Every day | ORAL | 0 refills | Status: DC

## 2020-01-21 MED ORDER — NIFEDIPINE ER 60 MG PO TB24: 60.0000 mg | ORAL_TABLET | Freq: Every day | ORAL | 0 refills | Status: DC

## 2020-01-21 MED ORDER — HYDROCHLOROTHIAZIDE 25 MG PO TABS
25.0000 mg | ORAL_TABLET | Freq: Every day | ORAL | Status: DC
  Administered 2020-01-21 – 2020-01-22 (×2): 25 mg via ORAL
  Filled 2020-01-21 (×2): qty 1

## 2020-01-21 NOTE — Consult Note (Signed)
Brief Pharmacy Note  Pharmacy has been consulted for our anti-hypertensive meds to beds program.   Patient has agreed to the program, and is aware she will be responsible for her regular outpatient prescription copay.   The patient's preferred pharmacy in Epic has been changed to [facility] employee pharmacy (so the electronic prescriptions can be sent there instead of their prior designated pharmacy.)    The electronic rx must be sent to employee pharmacy by 4:30pm the day of discharge for the meds to beds delivery to be possible.  Thank you for including pharmacy in this patient's care.  Albina Billet, Clinical Pharmacist 01/21/2020 9:27 AM

## 2020-01-21 NOTE — Lactation Note (Signed)
This note was copied from a baby's chart. Lactation Consultation Note  Patient Name: Brandi Higgins QIONG'E Date: 01/21/2020 Reason for consult: Initial assessment;Primapara;Early term 37-38.6wks;Other (Comment) (pumping and bottlefeeding) Age:32 hours  Maternal Data Formula Feeding for Exclusion: Yes Reason for exclusion: Mother's choice to formula and breast feed on admission (pumping and bottlefeeding) Does the patient have breastfeeding experience prior to this delivery?: No  Feeding Feeding Type: Bottle Fed - Formula Nipple Type: Slow - flow  LATCH Score Latch:  (not putting baby to breast)                 Interventions Interventions: DEBP LC name and no written on white board Mom encouraged and reassured about milk production, continued to encourage every 3 hr pumping , pumping drops of colostrum that are increasing  Lactation Tools Discussed/Used WIC Program: No Pump Education: Setup, frequency, and cleaning;Milk Storage Initiated by:: initiated after birth   Consult Status Consult Status: PRN    Dyann Kief 01/21/2020, 12:44 PM

## 2020-01-22 ENCOUNTER — Other Ambulatory Visit: Payer: Self-pay

## 2020-01-22 MED ORDER — LABETALOL HCL 200 MG PO TABS: 400.0000 mg | ORAL_TABLET | Freq: Two times a day (BID) | ORAL | 1 refills | Status: DC

## 2020-01-22 MED ORDER — IBUPROFEN 600 MG PO TABS: 600.0000 mg | ORAL_TABLET | Freq: Four times a day (QID) | ORAL | 1 refills | Status: DC | PRN

## 2020-01-22 MED ORDER — ACETAMINOPHEN 325 MG PO TABS: 650.0000 mg | ORAL_TABLET | ORAL | Status: DC | PRN

## 2020-01-22 NOTE — Discharge Instructions (Signed)
Check blood pressure daily.   IF b/p 160/110 or greater repeat in 15 minutes.  If it is still 160/110 (either number) please go Emergency Department for evaluation.   Notify on call provider for persistent blood pressure 150/100 or greater   Please call your doctor or return to the ER if you experience any chest pains, shortness of breath, dizziness, visual changes, severe headache (unrelieved by pain meds), fever greater than 101, any heavy bleeding (saturating more than 1 pad per hour), large clots, or foul smelling discharge, any worsening abdominal pain and cramping that is not controlled by pain medication, any calf/leg pain or redness, any breast concerns (redness/pain), or any signs of postpartum depression. No tampons, enemas, douches, or sexual intercourse for 6 weeks. Also avoid tub baths, hot tubs, or swimming for 6 weeks.     Vaginal Delivery, Care After Refer to this sheet in the next few weeks. These discharge instructions provide you with information on caring for yourself after delivery. Your caregiver may also give you specific instructions. Your treatment has been planned according to the most current medical practices available, but problems sometimes occur. Call your caregiver if you have any problems or questions after you go home. HOME CARE INSTRUCTIONS 1. Take over-the-counter or prescription medicines only as directed by your caregiver or pharmacist. 2. Do not drink alcohol, especially if you are breastfeeding or taking medicine to relieve pain. 3. Do not smoke tobacco. 4. Continue to use good perineal care. Good perineal care includes: 1. Wiping your perineum from back to front 2. Keeping your perineum clean. 3. You can do sitz baths twice a day, to help keep this area clean 5. Do not use tampons, douche or have sex until your caregiver says it is okay. 6. Shower only and avoid sitting in submerged water, aside from sitz baths 7. Wear a well-fitting bra that provides  breast support. 8. Eat healthy foods. 9. Drink enough fluids to keep your urine clear or pale yellow. 10. Eat high-fiber foods such as whole grain cereals and breads, brown rice, beans, and fresh fruits and vegetables every day. These foods may help prevent or relieve constipation. 11. Avoid constipation with high fiber foods or medications, such as miralax or metamucil 12. Follow your caregiver's recommendations regarding resumption of activities such as climbing stairs, driving, lifting, exercising, or traveling. 13. Talk to your caregiver about resuming sexual activities. Resumption of sexual activities is dependent upon your risk of infection, your rate of healing, and your comfort and desire to resume sexual activity. 14. Try to have someone help you with your household activities and your newborn for at least a few days after you leave the hospital. 15. Rest as much as possible. Try to rest or take a nap when your newborn is sleeping. 16. Increase your activities gradually. 17. Keep all of your scheduled postpartum appointments. It is very important to keep your scheduled follow-up appointments. At these appointments, your caregiver will be checking to make sure that you are healing physically and emotionally. SEEK MEDICAL CARE IF:   You are passing large clots from your vagina. Save any clots to show your caregiver.  You have a foul smelling discharge from your vagina.  You have trouble urinating.  You are urinating frequently.  You have pain when you urinate.  You have a change in your bowel movements.  You have increasing redness, pain, or swelling near your vaginal incision (episiotomy) or vaginal tear.  You have pus draining from your episiotomy  or vaginal tear.  Your episiotomy or vaginal tear is separating.  You have painful, hard, or reddened breasts.  You have a severe headache.  You have blurred vision or see spots.  You feel sad or depressed.  You have thoughts  of hurting yourself or your newborn.  You have questions about your care, the care of your newborn, or medicines.  You are dizzy or light-headed.  You have a rash.  You have nausea or vomiting.  You were breastfeeding and have not had a menstrual period within 12 weeks after you stopped breastfeeding.  You are not breastfeeding and have not had a menstrual period by the 12th week after delivery.  You have a fever. SEEK IMMEDIATE MEDICAL CARE IF:   You have persistent pain.  You have chest pain.  You have shortness of breath.  You faint.  You have leg pain.  You have stomach pain.  Your vaginal bleeding saturates two or more sanitary pads in 1 hour. MAKE SURE YOU:   Understand these instructions.  Will watch your condition.  Will get help right away if you are not doing well or get worse. Document Released: 12/16/1999 Document Revised: 05/04/2013 Document Reviewed: 08/15/2011 [facility] Patient Information 2015 [place], [place]. This information is not intended to replace advice given to you by your health care provider. Make sure you discuss any questions you have with your health care provider.  Sitz Bath A sitz bath is a warm water bath taken in the sitting position. The water covers only the hips and butt (buttocks). We recommend using one that fits in the toilet, to help with ease of use and cleanliness. It may be used for either healing or cleaning purposes. Sitz baths are also used to relieve pain, itching, or muscle tightening (spasms). The water may contain medicine. Moist heat will help you heal and relax.  HOME CARE  Take 3 to 4 sitz baths a day. 18. Fill the bathtub half-full with warm water. 19. Sit in the water and open the drain a little. 20. Turn on the warm water to keep the tub half-full. Keep the water running constantly. 21. Soak in the water for 15 to 20 minutes. 22. After the sitz bath, pat the affected area dry. GET HELP RIGHT AWAY IF: You get  worse instead of better. Stop the sitz baths if you get worse. MAKE SURE YOU:  Understand these instructions.  Will watch your condition.  Will get help right away if you are not doing well or get worse. Document Released: 01/26/2004 Document Revised: 09/12/2011 Document Reviewed: 04/17/2010 [facility] Patient Information 2015 [place], [place]. This information is not intended to replace advice given to you by your health care provider. Make sure you discuss any questions you have with your health care provider.

## 2020-01-22 NOTE — Progress Notes (Signed)
Mother discharged.  Discharge instructions given.  Mother verbalizes understanding. Baby has been previously discharged. Transported by auxiliary.

## 2020-01-26 DIAGNOSIS — O165 Unspecified maternal hypertension, complicating the puerperium: Secondary | ICD-10-CM | POA: Diagnosis not present

## 2020-02-03 DIAGNOSIS — O165 Unspecified maternal hypertension, complicating the puerperium: Secondary | ICD-10-CM | POA: Diagnosis not present

## 2020-02-03 DIAGNOSIS — Z3482 Encounter for supervision of other normal pregnancy, second trimester: Secondary | ICD-10-CM | POA: Diagnosis not present

## 2020-02-03 DIAGNOSIS — Z3483 Encounter for supervision of other normal pregnancy, third trimester: Secondary | ICD-10-CM | POA: Diagnosis not present

## 2020-02-22 DIAGNOSIS — O165 Unspecified maternal hypertension, complicating the puerperium: Secondary | ICD-10-CM | POA: Diagnosis not present

## 2020-03-10 DIAGNOSIS — Z1332 Encounter for screening for maternal depression: Secondary | ICD-10-CM | POA: Diagnosis not present

## 2020-03-10 DIAGNOSIS — Z124 Encounter for screening for malignant neoplasm of cervix: Secondary | ICD-10-CM | POA: Diagnosis not present

## 2020-03-15 DIAGNOSIS — R319 Hematuria, unspecified: Secondary | ICD-10-CM | POA: Diagnosis not present

## 2020-03-15 DIAGNOSIS — N390 Urinary tract infection, site not specified: Secondary | ICD-10-CM | POA: Diagnosis not present

## 2020-03-15 DIAGNOSIS — R300 Dysuria: Secondary | ICD-10-CM | POA: Diagnosis not present

## 2020-05-19 DIAGNOSIS — Z20828 Contact with and (suspected) exposure to other viral communicable diseases: Secondary | ICD-10-CM | POA: Diagnosis not present

## 2020-06-10 DIAGNOSIS — R059 Cough, unspecified: Secondary | ICD-10-CM | POA: Diagnosis not present

## 2020-06-10 DIAGNOSIS — J069 Acute upper respiratory infection, unspecified: Secondary | ICD-10-CM | POA: Diagnosis not present

## 2020-11-28 DIAGNOSIS — O3680X0 Pregnancy with inconclusive fetal viability, not applicable or unspecified: Secondary | ICD-10-CM | POA: Diagnosis not present

## 2020-11-30 DIAGNOSIS — O3680X0 Pregnancy with inconclusive fetal viability, not applicable or unspecified: Secondary | ICD-10-CM | POA: Diagnosis not present

## 2020-12-16 DIAGNOSIS — O219 Vomiting of pregnancy, unspecified: Secondary | ICD-10-CM | POA: Diagnosis not present

## 2020-12-16 DIAGNOSIS — O468X1 Other antepartum hemorrhage, first trimester: Secondary | ICD-10-CM | POA: Diagnosis not present

## 2020-12-16 DIAGNOSIS — N912 Amenorrhea, unspecified: Secondary | ICD-10-CM | POA: Diagnosis not present

## 2020-12-16 DIAGNOSIS — O418X10 Other specified disorders of amniotic fluid and membranes, first trimester, not applicable or unspecified: Secondary | ICD-10-CM | POA: Diagnosis not present

## 2021-01-01 NOTE — L&D Delivery Note (Signed)
Delivery Note  Brandi Higgins is a 31 y.o. G15P1011 female at [redacted]w[redacted]d, Patient's last menstrual period was 10/20/2020 (approximate)., consistent with [place] at [redacted]w[redacted]d, with Estimated Date of Delivery: 07/27/21.  She presented to L&D for scheduled IOL for elective at term.  First Stage: Labor onset: 1730 Induction: misoprostol, oxytocin, and AROM Analgesia /Anesthesia intrapartum: Epidural AROM at 2321 GBS: negative  Second Stage: Complete dilation at 07/23/2021 at 0053 Onset of pushing at 0059 FHR second stage 140 bpm with moderate, early and variable decels with pushing   [name] presented to L&D for an elective IOL at term. She was actively managed with misoprostol for cervical ripening and then oxytocin.  She progressed quickly after AROM to C/C/+1 with a strong urge to push.  She pushed effectively over approximately 90 minutes and utilized multiple positions to help with fetal rotation and descent.  Baby remained persistently OP despite attempts at rotation and [name] pushed hard for a spontaneous vaginal birth.  Delivery of a viable baby boy on 07/23/2021 at 0235 by CNM Delivery of fetal head in LOP position with restitution to LOT. No nuchal cord;  Anterior then posterior shoulders delivered easily with gentle downward traction. Baby placed on mom's chest, and attended to by baby RN Cord double clamped after cessation of pulsation, cut by father of baby.  Cord blood sample collection: Not Indicated A POS  Third Stage: Oxytocin bolus started after delivery of infant for hemorrhage prophylaxis  Placenta delivered intact with 3 VC @ 0243 Placenta disposition: discarded  Uterine tone firm / bleeding moderate   Right 1st degree sulcal and 2nd perineal laceration identified  Anesthesia for repair: Epidural Repair with 2-0 Vicryl CT in usual fashion  Est. Blood Loss (mL): 100 ml  Complications: None   Mom to postpartum.  Baby to Couplet care / Skin to Skin.  Newborn: Information for  the patient's newborn:  Emerson, Barretto [[id number]]  Live born female "Maddox" Birth Weight: 8 lb 11 oz (3941 g) APGAR: 8, 9  Newborn Delivery   Birth date/time: 07/23/2021 02:35:00 Delivery type: Vaginal, Spontaneous     Feeding planned: formula feeding and expressed breast milk  ---------- Margaretmary Eddy, CNM Certified Nurse Midwife [facility]  [facility]

## 2021-01-19 DIAGNOSIS — Z3490 Encounter for supervision of normal pregnancy, unspecified, unspecified trimester: Secondary | ICD-10-CM | POA: Insufficient documentation

## 2021-01-20 DIAGNOSIS — Z114 Encounter for screening for human immunodeficiency virus [HIV]: Secondary | ICD-10-CM | POA: Diagnosis not present

## 2021-01-20 DIAGNOSIS — Z6833 Body mass index (BMI) 33.0-33.9, adult: Secondary | ICD-10-CM | POA: Diagnosis not present

## 2021-01-20 DIAGNOSIS — O468X1 Other antepartum hemorrhage, first trimester: Secondary | ICD-10-CM | POA: Diagnosis not present

## 2021-01-20 DIAGNOSIS — R69 Illness, unspecified: Secondary | ICD-10-CM | POA: Diagnosis not present

## 2021-01-20 DIAGNOSIS — Z113 Encounter for screening for infections with a predominantly sexual mode of transmission: Secondary | ICD-10-CM | POA: Diagnosis not present

## 2021-01-20 DIAGNOSIS — O418X10 Other specified disorders of amniotic fluid and membranes, first trimester, not applicable or unspecified: Secondary | ICD-10-CM | POA: Diagnosis not present

## 2021-01-20 DIAGNOSIS — Z3482 Encounter for supervision of other normal pregnancy, second trimester: Secondary | ICD-10-CM | POA: Diagnosis not present

## 2021-01-20 LAB — OB RESULTS CONSOLE RUBELLA ANTIBODY, IGM: Rubella: IMMUNE

## 2021-01-20 LAB — OB RESULTS CONSOLE VARICELLA ZOSTER ANTIBODY, IGG: Varicella: IMMUNE

## 2021-01-24 DIAGNOSIS — Z3482 Encounter for supervision of other normal pregnancy, second trimester: Secondary | ICD-10-CM | POA: Diagnosis not present

## 2021-01-31 ENCOUNTER — Telehealth: Payer: Self-pay | Admitting: Obstetrics and Gynecology

## 2021-01-31 NOTE — Telephone Encounter (Signed)
Brandi Higgins called after getting a reminder call for her genetic counseling visit scheduled for 02/02/21 due to a family history of Wolff-Parkinson-White syndrome in her partners family.  She was unaware of this appointment and cannot come that day.  She was also unclear as to the reason for the consultation, so we reviewed the reason and what is involved in genetic counseling.  She would like to speak with her partner and call back to reschedule if they desire a visit.  I encouraged her to try and get more details about the family history and any possible genetic testing in the family.    We also reviewed briefly the normal results of her MaterniT21 screening. We can be reached at [phone].  Cherly Anderson, MS, CGC

## 2021-02-02 ENCOUNTER — Ambulatory Visit: Payer: 59

## 2021-02-17 DIAGNOSIS — Z3482 Encounter for supervision of other normal pregnancy, second trimester: Secondary | ICD-10-CM | POA: Diagnosis not present

## 2021-03-10 DIAGNOSIS — Z3482 Encounter for supervision of other normal pregnancy, second trimester: Secondary | ICD-10-CM | POA: Diagnosis not present

## 2021-03-17 DIAGNOSIS — H5213 Myopia, bilateral: Secondary | ICD-10-CM | POA: Diagnosis not present

## 2021-04-14 DIAGNOSIS — O99213 Obesity complicating pregnancy, third trimester: Secondary | ICD-10-CM | POA: Diagnosis present

## 2021-05-05 DIAGNOSIS — Z23 Encounter for immunization: Secondary | ICD-10-CM | POA: Diagnosis not present

## 2021-05-05 DIAGNOSIS — Z3483 Encounter for supervision of other normal pregnancy, third trimester: Secondary | ICD-10-CM | POA: Diagnosis not present

## 2021-06-15 DIAGNOSIS — Z3483 Encounter for supervision of other normal pregnancy, third trimester: Secondary | ICD-10-CM | POA: Diagnosis not present

## 2021-06-19 DIAGNOSIS — Z3482 Encounter for supervision of other normal pregnancy, second trimester: Secondary | ICD-10-CM | POA: Diagnosis not present

## 2021-06-19 DIAGNOSIS — Z3483 Encounter for supervision of other normal pregnancy, third trimester: Secondary | ICD-10-CM | POA: Diagnosis not present

## 2021-06-20 DIAGNOSIS — O169 Unspecified maternal hypertension, unspecified trimester: Secondary | ICD-10-CM | POA: Diagnosis not present

## 2021-06-27 DIAGNOSIS — O169 Unspecified maternal hypertension, unspecified trimester: Secondary | ICD-10-CM | POA: Diagnosis not present

## 2021-06-30 DIAGNOSIS — O99213 Obesity complicating pregnancy, third trimester: Secondary | ICD-10-CM | POA: Diagnosis not present

## 2021-07-03 DIAGNOSIS — Z3483 Encounter for supervision of other normal pregnancy, third trimester: Secondary | ICD-10-CM | POA: Diagnosis not present

## 2021-07-03 LAB — OB RESULTS CONSOLE RPR: RPR: NONREACTIVE

## 2021-07-03 LAB — OB RESULTS CONSOLE HEPATITIS B SURFACE ANTIGEN: Hepatitis B Surface Ag: NEGATIVE

## 2021-07-03 LAB — OB RESULTS CONSOLE HIV ANTIBODY (ROUTINE TESTING): HIV: NONREACTIVE

## 2021-07-03 LAB — OB RESULTS CONSOLE GBS: GBS: NEGATIVE

## 2021-07-10 DIAGNOSIS — O99891 Other specified diseases and conditions complicating pregnancy: Secondary | ICD-10-CM | POA: Diagnosis not present

## 2021-07-10 DIAGNOSIS — Z8759 Personal history of other complications of pregnancy, childbirth and the puerperium: Secondary | ICD-10-CM | POA: Diagnosis not present

## 2021-07-13 ENCOUNTER — Other Ambulatory Visit: Payer: Self-pay | Admitting: Obstetrics and Gynecology

## 2021-07-13 DIAGNOSIS — Z3490 Encounter for supervision of normal pregnancy, unspecified, unspecified trimester: Secondary | ICD-10-CM

## 2021-07-13 NOTE — Progress Notes (Signed)
Brandi Higgins is a 31 y.o. G39P1011 female at [redacted]w[redacted]d dated by LMP.  She presents to L&D for elective IOL.  Pregnancy Issues: 1. Previous breech - resolved 2. H/o Pre-E 3. Obesity   Prenatal care site: [facility]   Pertinent Results:  Prenatal Labs: Blood type/Rh A Pos  Antibody screen neg  Rubella Immune  Varicella Immune  RPR NR  HBsAg Neg  HIV NR  GC neg  Chlamydia neg  Genetic screening negative  1 hour GTT 193  3 hour GTT 2 weeks of finger sticks normal  GBS Neg    5. Post Partum Planning: - Infant feeding: Expressed Breast Milk - Contraception: Considering Pills - possible vasectomy - Tdap given 05/05/21 - Flu n/a  Haroldine Laws, CNM 07/13/2021 9:42 AM

## 2021-07-22 ENCOUNTER — Other Ambulatory Visit: Payer: Self-pay

## 2021-07-22 ENCOUNTER — Inpatient Hospital Stay: Payer: 59 | Admitting: Anesthesiology

## 2021-07-22 ENCOUNTER — Inpatient Hospital Stay
Admission: EM | Admit: 2021-07-22 | Discharge: 2021-07-24 | DRG: 806 | Disposition: A | Discharge status: Home / Self Care | Payer: 59

## 2021-07-22 DIAGNOSIS — O99214 Obesity complicating childbirth: Secondary | ICD-10-CM | POA: Diagnosis not present

## 2021-07-22 DIAGNOSIS — O26893 Other specified pregnancy related conditions, third trimester: Secondary | ICD-10-CM | POA: Diagnosis not present

## 2021-07-22 DIAGNOSIS — D62 Acute posthemorrhagic anemia: Secondary | ICD-10-CM | POA: Diagnosis not present

## 2021-07-22 DIAGNOSIS — O99213 Obesity complicating pregnancy, third trimester: Secondary | ICD-10-CM | POA: Diagnosis present

## 2021-07-22 DIAGNOSIS — Z3490 Encounter for supervision of normal pregnancy, unspecified, unspecified trimester: Principal | ICD-10-CM | POA: Diagnosis present

## 2021-07-22 DIAGNOSIS — Z3A39 39 weeks gestation of pregnancy: Secondary | ICD-10-CM

## 2021-07-22 DIAGNOSIS — O9081 Anemia of the puerperium: Secondary | ICD-10-CM | POA: Diagnosis not present

## 2021-07-22 DIAGNOSIS — R030 Elevated blood-pressure reading, without diagnosis of hypertension: Secondary | ICD-10-CM | POA: Diagnosis not present

## 2021-07-22 LAB — COMPREHENSIVE METABOLIC PANEL
ALT: 12 U/L (ref 0–44)
AST: 24 U/L (ref 15–41)
Albumin: 3.1 g/dL — ABNORMAL LOW (ref 3.5–5.0)
Alkaline Phosphatase: 97 U/L (ref 38–126)
Anion gap: 8 (ref 5–15)
BUN: 9 mg/dL (ref 6–20)
CO2: 17 mmol/L — ABNORMAL LOW (ref 22–32)
Calcium: 9 mg/dL (ref 8.9–10.3)
Chloride: 111 mmol/L (ref 98–111)
Creatinine, Ser: 0.67 mg/dL (ref 0.44–1.00)
GFR, Estimated: >60 mL/min (ref >60)
Glucose, Bld: 104 mg/dL — ABNORMAL HIGH (ref 70–99)
Potassium: 3.3 mmol/L — ABNORMAL LOW (ref 3.5–5.1)
Sodium: 136 mmol/L (ref 135–145)
Total Bilirubin: 0.8 mg/dL (ref 0.3–1.2)
Total Protein: 6.8 g/dL (ref 6.5–8.1)

## 2021-07-22 LAB — CBC
HCT: 33.5 % — ABNORMAL LOW (ref 36.0–46.0)
Hemoglobin: 11.3 g/dL — ABNORMAL LOW (ref 12.0–15.0)
MCH: 29 pg (ref 26.0–34.0)
MCHC: 33.7 g/dL (ref 30.0–36.0)
MCV: 86.1 fL (ref 80.0–100.0)
Platelets: 233 10*3/uL (ref 150–400)
RBC: 3.89 MIL/uL (ref 3.87–5.11)
RDW: 14.3 % (ref 11.5–15.5)
WBC: 11.7 10*3/uL — ABNORMAL HIGH (ref 4.0–10.5)
nRBC: 0 % (ref 0.0–0.2)

## 2021-07-22 LAB — PROTEIN / CREATININE RATIO, URINE
Creatinine, Urine: 67 mg/dL
Protein Creatinine Ratio: 0.19 mg/mg{Cre} — ABNORMAL HIGH (ref 0.00–0.15)
Total Protein, Urine: 13 mg/dL

## 2021-07-22 LAB — TYPE AND SCREEN
ABO/RH(D): A POS
Antibody Screen: NEGATIVE

## 2021-07-22 MED ORDER — LACTATED RINGERS IV SOLN: INTRAVENOUS | Status: DC

## 2021-07-22 MED ORDER — PHENYLEPHRINE 80 MCG/ML (10ML) SYRINGE FOR IV PUSH (FOR BLOOD PRESSURE SUPPORT): 80.0000 ug | PREFILLED_SYRINGE | INTRAVENOUS | Status: DC | PRN

## 2021-07-22 MED ORDER — MISOPROSTOL 25 MCG QUARTER TABLET
25.0000 ug | ORAL_TABLET | ORAL | Status: DC
  Administered 2021-07-22: 25 ug via ORAL
  Filled 2021-07-22: qty 1

## 2021-07-22 MED ORDER — OXYTOCIN 10 UNIT/ML IJ SOLN
INTRAMUSCULAR | Status: AC
  Filled 2021-07-22: qty 2

## 2021-07-22 MED ORDER — SODIUM CHLORIDE 0.9% FLUSH: 3.0000 mL | Freq: Two times a day (BID) | INTRAVENOUS | Status: DC

## 2021-07-22 MED ORDER — OXYCODONE-ACETAMINOPHEN 5-325 MG PO TABS: 1.0000 | ORAL_TABLET | ORAL | Status: DC | PRN

## 2021-07-22 MED ORDER — OXYTOCIN-SODIUM CHLORIDE 30-0.9 UT/500ML-% IV SOLN
1.0000 m[IU]/min | INTRAVENOUS | Status: DC
  Administered 2021-07-22: 2 m[IU]/min via INTRAVENOUS
  Filled 2021-07-22: qty 500

## 2021-07-22 MED ORDER — DIPHENHYDRAMINE HCL 50 MG/ML IJ SOLN: 12.5000 mg | INTRAMUSCULAR | Status: DC | PRN

## 2021-07-22 MED ORDER — SODIUM CHLORIDE 0.9 % IV SOLN
INTRAVENOUS | Status: DC | PRN
  Administered 2021-07-22: 8 mL via EPIDURAL

## 2021-07-22 MED ORDER — ONDANSETRON HCL 4 MG/2ML IJ SOLN: 4.0000 mg | Freq: Four times a day (QID) | INTRAMUSCULAR | Status: DC | PRN

## 2021-07-22 MED ORDER — OXYTOCIN BOLUS FROM INFUSION
333.0000 mL | Freq: Once | INTRAVENOUS | Status: AC
  Administered 2021-07-23: 333 mL via INTRAVENOUS

## 2021-07-22 MED ORDER — ACETAMINOPHEN 325 MG PO TABS: 650.0000 mg | ORAL_TABLET | ORAL | Status: DC | PRN

## 2021-07-22 MED ORDER — MISOPROSTOL 200 MCG PO TABS
ORAL_TABLET | ORAL | Status: AC
  Filled 2021-07-22: qty 4

## 2021-07-22 MED ORDER — EPHEDRINE 5 MG/ML INJ: 10.0000 mg | INTRAVENOUS | Status: DC | PRN

## 2021-07-22 MED ORDER — LACTATED RINGERS IV SOLN: 500.0000 mL | INTRAVENOUS | Status: DC | PRN

## 2021-07-22 MED ORDER — SODIUM CHLORIDE 0.9 % IV SOLN: 250.0000 mL | INTRAVENOUS | Status: DC | PRN

## 2021-07-22 MED ORDER — MISOPROSTOL 25 MCG QUARTER TABLET
25.0000 ug | ORAL_TABLET | ORAL | Status: DC | PRN
  Administered 2021-07-22: 25 ug via VAGINAL
  Filled 2021-07-22: qty 1

## 2021-07-22 MED ORDER — AMMONIA AROMATIC IN INHA
RESPIRATORY_TRACT | Status: AC
  Filled 2021-07-22: qty 10

## 2021-07-22 MED ORDER — TERBUTALINE SULFATE 1 MG/ML IJ SOLN: 0.2500 mg | Freq: Once | INTRAMUSCULAR | Status: DC | PRN

## 2021-07-22 MED ORDER — SOD CITRATE-CITRIC ACID 500-334 MG/5ML PO SOLN: 30.0000 mL | ORAL | Status: DC | PRN

## 2021-07-22 MED ORDER — FENTANYL-BUPIVACAINE-NACL 0.5-0.125-0.9 MG/250ML-% EP SOLN
12.0000 mL/h | EPIDURAL | Status: DC | PRN
  Administered 2021-07-22: 12 mL/h via EPIDURAL
  Filled 2021-07-22: qty 250

## 2021-07-22 MED ORDER — MISOPROSTOL 25 MCG QUARTER TABLET: 25.0000 ug | ORAL_TABLET | ORAL | Status: DC

## 2021-07-22 MED ORDER — LIDOCAINE HCL (PF) 1 % IJ SOLN
INTRAMUSCULAR | Status: DC | PRN
  Administered 2021-07-22: 3 mL via SUBCUTANEOUS

## 2021-07-22 MED ORDER — LIDOCAINE-EPINEPHRINE (PF) 1.5 %-1:200000 IJ SOLN
INTRAMUSCULAR | Status: DC | PRN
  Administered 2021-07-22: 3 mL via EPIDURAL

## 2021-07-22 MED ORDER — LIDOCAINE HCL (PF) 1 % IJ SOLN
30.0000 mL | INTRAMUSCULAR | Status: DC | PRN
  Filled 2021-07-22: qty 30

## 2021-07-22 MED ORDER — SODIUM CHLORIDE 0.9% FLUSH: 3.0000 mL | INTRAVENOUS | Status: DC | PRN

## 2021-07-22 MED ORDER — OXYCODONE-ACETAMINOPHEN 5-325 MG PO TABS: 2.0000 | ORAL_TABLET | ORAL | Status: DC | PRN

## 2021-07-22 MED ORDER — FENTANYL CITRATE (PF) 100 MCG/2ML IJ SOLN: 50.0000 ug | INTRAMUSCULAR | Status: DC | PRN

## 2021-07-22 MED ORDER — OXYTOCIN-SODIUM CHLORIDE 30-0.9 UT/500ML-% IV SOLN
2.5000 [IU]/h | INTRAVENOUS | Status: DC
  Administered 2021-07-23: 2.5 [IU]/h via INTRAVENOUS

## 2021-07-22 MED ORDER — LACTATED RINGERS IV SOLN
500.0000 mL | Freq: Once | INTRAVENOUS | Status: AC
  Administered 2021-07-22: 500 mL via INTRAVENOUS

## 2021-07-22 NOTE — Anesthesia Preprocedure Evaluation (Signed)
Anesthesia Evaluation  Patient identified by MRN, date of birth, ID band Patient awake    Reviewed: Allergy & Precautions, NPO status , Patient's Chart, lab work & pertinent test results  History of Anesthesia Complications Negative for: history of anesthetic complications  Airway Mallampati: III  TM Distance: >3 FB Neck ROM: Full    Dental no notable dental hx. (+) Teeth Intact   Pulmonary neg pulmonary ROS, neg sleep apnea, neg COPD, Patient abstained from smoking.Not current smoker,    Pulmonary exam normal breath sounds clear to auscultation       Cardiovascular Exercise Tolerance: Good METS(-) hypertension(-) CAD and (-) Past MI negative cardio ROS  (-) dysrhythmias  Rhythm:Regular Rate:Normal - Systolic murmurs    Neuro/Psych  Headaches, negative psych ROS   GI/Hepatic neg GERD  ,(+)     (-) substance abuse  ,   Endo/Other  neg diabetes  Renal/GU negative Renal ROS     Musculoskeletal   Abdominal (+) + obese,   Peds  Hematology   Anesthesia Other Findings Past Medical History: No date: Abnormal Pap smear of vagina No date: Gestational diabetes No date: Heart murmur No date: History of ectopic pregnancy No date: Hypertension No date: Migraines No date: Swelling  Reproductive/Obstetrics (+) Pregnancy                             Anesthesia Physical Anesthesia Plan  ASA: 2  Anesthesia Plan: Epidural   Post-op Pain Management:    Induction:   PONV Risk Score and Plan: 2 and Treatment may vary due to age or medical condition and Ondansetron  Airway Management Planned: Natural Airway  Additional Equipment:   Intra-op Plan:   Post-operative Plan:   Informed Consent: I have reviewed the patients History and Physical, chart, labs and discussed the procedure including the risks, benefits and alternatives for the proposed anesthesia with the patient or authorized  representative who has indicated his/her understanding and acceptance.       Plan Discussed with: Surgeon  Anesthesia Plan Comments: (Poor experience with prior epidural, said it was difficult to get in and didn't work well.  Discussed R/B/A of neuraxial anesthesia technique with patient: - rare risks of spinal/epidural hematoma, nerve damage, infection - Risk of PDPH - Risk of itching - Risk of nausea and vomiting - Risk of poor block necessitating replacement of epidural. - Risk of allergic reactions. Patient voiced understanding.)        Anesthesia Quick Evaluation

## 2021-07-22 NOTE — H&P (Signed)
OB History & Physical   History of Present Illness:   Chief Complaint: scheduled IOL   HPI:  Brandi Higgins is a 31 y.o. G69P1011 female at [redacted]w[redacted]d, Patient's last menstrual period was 10/20/2020 (approximate)., consistent with [place] at [redacted]w[redacted]d, with Estimated Date of Delivery: 07/27/21.  She presents to L&D for scheduled IOL for elective at term.  Reports active fetal movement  Contractions: denies  LOF/SROM: denies  Vaginal bleeding: denies   Factors complicating pregnancy:  Obesity in pregnancy History of preeclampsia  Elevated 1hr GTT   Patient Active Problem List   Diagnosis Date Noted   Encounter for elective induction of labor 07/22/2021   Obesity affecting pregnancy in third trimester 04/14/2021   Supervision of normal pregnancy 01/19/2021   Migraine without aura and without status migrainosus, not intractable 01/04/2014    Prenatal Transfer Tool  Maternal Diabetes: No Genetic Screening: Normal Maternal Ultrasounds/Referrals: Normal Fetal Ultrasounds or other Referrals:  None Maternal Substance Abuse:  No Significant Maternal Medications:  None Significant Maternal Lab Results: None  Maternal Medical History:   Past Medical History:  Diagnosis Date   Abnormal Pap smear of vagina    Gestational diabetes    Heart murmur    History of ectopic pregnancy    Hypertension    Migraines    Swelling     Past Surgical History:  Procedure Laterality Date   SHOULDER SURGERY     WISDOM TOOTH EXTRACTION     WRIST SURGERY      No Known Allergies  Prior to Admission medications   Medication Sig Start Date End Date Taking? Authorizing Provider  acetaminophen (TYLENOL) 325 MG tablet Take 2 tablets (650 mg total) by mouth every 4 (four) hours as needed (for pain scale < 4). 01/22/20   Gustavo Lah, CNM  hydrochlorothiazide (HYDRODIURIL) 25 MG tablet TAKE 1 TABLET BY MOUTH DAILY. 01/21/20 01/20/21  McVey, Prudencio Pair, CNM  labetalol (NORMODYNE) 200 MG tablet TAKE 2 TABLETS BY  MOUTH 2 TIMES DAILY. 01/22/20 01/21/21  Gustavo Lah, CNM  NIFEdipine (ADALAT CC) 30 MG 24 hr tablet TAKE 2 TABLETS BY MOUTH DAILY. 01/21/20 01/20/21  McVey, Prudencio Pair, CNM  pantoprazole (PROTONIX) 40 MG tablet Take 40 mg by mouth daily. 12/01/19 11/30/20  [provider]  Prenatal 27-1 MG TABS Take by mouth.    [provider]     Prenatal care site:  [facility]  OB History  Gravida Para Term Preterm AB Living  3 1 1  0 1 1  SAB IAB Ectopic Multiple Live Births  0 0 1 0 1    # Outcome Date GA Lbr Len/2nd Weight Sex Delivery Anes PTL Lv  3 Current           2 Term 01/19/20 [redacted]w[redacted]d / 02:37 3540 g F Vag-Spont EPI  LIV     Name: Gillies,GIRL Camauri     Apgar1: 6  Apgar5: 9  1 Ectopic              Social History: She  reports that she has never smoked. She has never used smokeless tobacco. She reports that she does not currently use alcohol. She reports that she does not use drugs.  Family History: family history includes Diabetes in her maternal grandmother and paternal grandmother.   Review of Systems: A full review of systems was performed and negative except as noted in the HPI.     Physical Exam:  Vital Signs: BP 125/78   Pulse [redacted]w[redacted]d)  102   Temp 97.8 F (36.6 C) (Oral)   LMP 10/20/2020 (Approximate)   General: no acute distress.  HEENT: normocephalic, atraumatic Heart: regular rate & rhythm Lungs: normal respiratory effort Abdomen: soft, gravid, non-tender;  EFW: 7 1/2-8lbs  Pelvic:   External: Normal external female genitalia  Cervix:  FT/50/-2/posterior, vaginal bleeding noted after exam   Extremities: non-tender, symmetric, 2+ edema bilaterally.  DTRs: 2+/2+  Neurologic: Alert & oriented x 3.    Results for orders placed or performed during the hospital encounter of 07/22/21 (from the past 24 hour(s))  CBC     Status: Abnormal   Collection Time: 07/22/21  1:13 PM  Result Value Ref Range   WBC 11.7 (H) 4.0 - 10.5 K/uL   RBC 3.89 3.87 -  5.11 MIL/uL   Hemoglobin 11.3 (L) 12.0 - 15.0 g/dL   HCT 54.2 (L) 70.6 - 23.7 %   MCV 86.1 80.0 - 100.0 fL   MCH 29.0 26.0 - 34.0 pg   MCHC 33.7 30.0 - 36.0 g/dL   RDW 62.8 31.5 - 17.6 %   Platelets 233 150 - 400 K/uL   nRBC 0.0 0.0 - 0.2 %  Type and screen     Status: None (Preliminary result)   Collection Time: 07/22/21  1:13 PM  Result Value Ref Range   ABO/RH(D) PENDING    Antibody Screen PENDING    Sample Expiration      07/25/2021,2359 Performed at [facility], [address] [place], [place] [address]     Pertinent Results:  Prenatal Labs: Blood type/Rh A Pos  Antibody screen neg  Rubella Immune  Varicella Immune  RPR NR  HBsAg Neg  HIV NR  GC neg  Chlamydia neg  Genetic screening negative  1 hour GTT 193  3 hour GTT 2 weeks of finger sticks normal  GBS Neg   FHT:  FHR: 135 bpm, variability: moderate,  accelerations:  Present,  decelerations:  Absent Category/reactivity:  Category I UC:   none   Cephalic by Leopolds and SVE   No results found.  Assessment:  BLAKELYNN SCHEELER is a 31 y.o. G66P1011 female at [redacted]w[redacted]d with scheduled IOL for elective at term.   Plan:  1. Admit to Labor & Delivery - consents reviewed and obtained  2. Fetal Well being  - Fetal Tracing: cat 1 - Group B Streptococcus ppx not indicated: GBS negative - Presentation: cephalic confirmed by SVE   3. Routine OB: - Prenatal labs reviewed, as above - Rh positive - CBC, T&S, RPR on admit - Regular diet, saline lock  4. Induction of labor  - Contractions monitored with external toco - Pelvis proven to 3540g, adequate for trial of labor  - Plan for induction with misoprostol  - Induction with oxytocin, AROM, and cervical balloon as appropriate  - Plan for  continuous fetal monitoring - Maternal pain control as desired - Anticipate vaginal delivery  5. Post Partum Planning: - Infant feeding: expressed breast milk - Contraception: oral progesterone-only  contraceptive, vasectomy - Tdap vaccine: Given prenatally - 05/05/2021 - Flu vaccine:  N/A  Gustavo Lah, CNM 07/22/21 1:34 PM  Margaretmary Eddy, CNM Certified Nurse Midwife [place]  [facility]

## 2021-07-22 NOTE — Anesthesia Procedure Notes (Signed)
Epidural Patient location during procedure: OB  Staffing Anesthesiologist: Corinda Gubler, MD Performed: anesthesiologist   Preanesthetic Checklist Completed: patient identified, IV checked, site marked, risks and benefits discussed, surgical consent, monitors and equipment checked, pre-op evaluation and timeout performed  Epidural Patient position: sitting Prep: ChloraPrep Patient monitoring: heart rate, continuous pulse ox and blood pressure Approach: midline Location: L3-L4 Injection technique: LOR saline  Needle:  Needle type: Tuohy  Needle gauge: 17 G Needle length: 9 cm Needle insertion depth: 6.5 cm Catheter type: closed end flexible Catheter size: 19 Gauge Catheter at skin depth: 12 cm Test dose: negative and 1.5% lidocaine with Epi 1:200 K  Assessment Sensory level: T10 Events: blood not aspirated, injection not painful, no injection resistance, no paresthesia and negative IV test  Additional Notes first attempt Pt. Evaluated and documentation done after procedure finished. Patient identified. Risks/Benefits/Options discussed with patient including but not limited to bleeding, infection, nerve damage, paralysis, failed block, incomplete pain control, headache, blood pressure changes, nausea, vomiting, reactions to medication both or allergic, itching and postpartum back pain. Confirmed with bedside nurse the patient's most recent platelet count. Confirmed with patient that they are not currently taking any anticoagulation, have any bleeding history or any family history of bleeding disorders. Patient expressed understanding and wished to proceed. All questions were answered. Sterile technique was used throughout the entire procedure. Please see nursing notes for vital signs. Test dose was given through epidural catheter and negative prior to continuing to dose epidural or start infusion. Warning signs of high block given to the patient including shortness of breath,  tingling/numbness in hands, complete motor block, or any concerning symptoms with instructions to call for help. Patient was given instructions on fall risk and not to get out of bed. All questions and concerns addressed with instructions to call with any issues or inadequate analgesia.     Patient tolerated the insertion well without immediate complications.  Reason for block: procedure for painReason for block:procedure for pain

## 2021-07-22 NOTE — Progress Notes (Signed)
Labor Progress Note  Brandi Higgins is a 31 y.o. G3P1011 at [redacted]w[redacted]d by LMP admitted for induction of labor due to Elective at term.  Subjective: feeling more comfortable after epidural but feeling more on right side   Objective: BP 127/78   Pulse 97   Temp 98.1 F (36.7 C) (Oral)   Resp 16   Ht 5\' 5"  (1.651 m)   Wt 104.3 kg   LMP 10/20/2020 (Exact Date)   SpO2 99%   BMI 38.27 kg/m  Notable VS details: reviewed   Fetal Assessment: FHT:  FHR: 130 bpm, variability: moderate,  accelerations:  Present,  decelerations:  Absent Category/reactivity:  Category I UC:   regular, every 2-5 minutes SVE:   4/70/-2/ballotable  Membrane status: Intact Amniotic color: N/A  Labs: Lab Results  Component Value Date   WBC 11.7 (H) 07/22/2021   HGB 11.3 (L) 07/22/2021   HCT 33.5 (L) 07/22/2021   MCV 86.1 07/22/2021   PLT 233 07/22/2021    Assessment / Plan: Induction of labor due to elective at term,  progressing well on pitocin -s/p 1 dose of vaginal and oral misoprostol  -Oxytocin infusing at 6 milliunits/min - will continue to titrate for labor and maternal/fetal response  -Positioned to right side to help with uptake of epidural  -Will use a variety of positions to help with fetal descent  -AROM when appropriate if needed for augmentation   Labor: Progressing normally on oxytocin  Preeclampsia:   BP's remain WNL, labs on admission WNL Fetal Wellbeing:  Category I Pain Control:  Epidural I/D:  n/a Anticipated MOD:  NSVD  07/24/2021, CNM 07/22/2021, 8:52 PM

## 2021-07-22 NOTE — Progress Notes (Signed)
Labor Progress Note  Brandi Higgins is a 31 y.o. G3P1011 at [redacted]w[redacted]d by LMP admitted for induction of labor due to Elective at term.  Subjective: feeling more comfortable on both sides with epidural   Objective: BP 127/78   Pulse 97   Temp 98 F (36.7 C) (Axillary)   Resp 16   Ht 5\' 5"  (1.651 m)   Wt 104.3 kg   LMP 10/20/2020 (Exact Date)   SpO2 99%   BMI 38.27 kg/m  Notable VS details: reviewed   Fetal Assessment: FHT:  FHR: 135 bpm, variability: moderate,  accelerations:  Present,  decelerations:  Absent Category/reactivity:  Category I UC:   regular, every 2-4 minutes SVE:   5/70/-1 to -2/Ant Membrane status: AROM at 2321 Amniotic color: Clear   Labs: Lab Results  Component Value Date   WBC 11.7 (H) 07/22/2021   HGB 11.3 (L) 07/22/2021   HCT 33.5 (L) 07/22/2021   MCV 86.1 07/22/2021   PLT 233 07/22/2021    Assessment / Plan: Induction of labor due to elective at term,  progressing well on pitocin -s/p 1 dose of vaginal and oral misoprostol  -Oxytocin infusing at 10 milliunits/min - will continue to titrate for labor and maternal/fetal response   Labor: Progressing normally Preeclampsia:   BP's remain WNL, labs on admission WNL Fetal Wellbeing:  Category I Pain Control:  Epidural I/D:  n/a Anticipated MOD:  NSVD  07/24/2021, CNM 07/22/2021, 11:28 PM

## 2021-07-23 ENCOUNTER — Encounter: Payer: Self-pay | Admitting: Obstetrics and Gynecology

## 2021-07-23 LAB — RPR: RPR Ser Ql: NONREACTIVE

## 2021-07-23 LAB — CBC
HCT: 32.3 % — ABNORMAL LOW (ref 36.0–46.0)
Hemoglobin: 10.8 g/dL — ABNORMAL LOW (ref 12.0–15.0)
MCH: 29.3 pg (ref 26.0–34.0)
MCHC: 33.4 g/dL (ref 30.0–36.0)
MCV: 87.5 fL (ref 80.0–100.0)
Platelets: 215 10*3/uL (ref 150–400)
RBC: 3.69 MIL/uL — ABNORMAL LOW (ref 3.87–5.11)
RDW: 14.3 % (ref 11.5–15.5)
WBC: 21.3 10*3/uL — ABNORMAL HIGH (ref 4.0–10.5)
nRBC: 0 % (ref 0.0–0.2)

## 2021-07-23 MED ORDER — SODIUM CHLORIDE 0.9% FLUSH
3.0000 mL | Freq: Two times a day (BID) | INTRAVENOUS | Status: DC
  Administered 2021-07-23 (×2): 3 mL via INTRAVENOUS

## 2021-07-23 MED ORDER — OXYCODONE HCL 5 MG PO TABS: 5.0000 mg | ORAL_TABLET | ORAL | Status: DC | PRN

## 2021-07-23 MED ORDER — ONDANSETRON HCL 4 MG PO TABS: 4.0000 mg | ORAL_TABLET | ORAL | Status: DC | PRN

## 2021-07-23 MED ORDER — OXYCODONE HCL 5 MG PO TABS: 10.0000 mg | ORAL_TABLET | ORAL | Status: DC | PRN

## 2021-07-23 MED ORDER — CALCIUM CARBONATE ANTACID 500 MG PO CHEW
1.0000 | CHEWABLE_TABLET | ORAL | Status: DC | PRN
  Administered 2021-07-23: 200 mg via ORAL
  Filled 2021-07-23: qty 1

## 2021-07-23 MED ORDER — SODIUM CHLORIDE 0.9 % IV SOLN: 250.0000 mL | INTRAVENOUS | Status: DC | PRN

## 2021-07-23 MED ORDER — ACETAMINOPHEN 500 MG PO TABS
1000.0000 mg | ORAL_TABLET | Freq: Four times a day (QID) | ORAL | Status: DC
  Administered 2021-07-23 – 2021-07-24 (×5): 1000 mg via ORAL
  Filled 2021-07-23 (×5): qty 2

## 2021-07-23 MED ORDER — FERROUS SULFATE 325 (65 FE) MG PO TABS
325.0000 mg | ORAL_TABLET | Freq: Two times a day (BID) | ORAL | Status: DC
  Administered 2021-07-23 – 2021-07-24 (×2): 325 mg via ORAL
  Filled 2021-07-23 (×3): qty 1

## 2021-07-23 MED ORDER — WITCH HAZEL-GLYCERIN EX PADS
1.0000 | MEDICATED_PAD | CUTANEOUS | Status: DC | PRN
  Administered 2021-07-23: 1 via TOPICAL
  Filled 2021-07-23: qty 100

## 2021-07-23 MED ORDER — SIMETHICONE 80 MG PO CHEW: 80.0000 mg | CHEWABLE_TABLET | ORAL | Status: DC | PRN

## 2021-07-23 MED ORDER — DIBUCAINE (PERIANAL) 1 % EX OINT: 1.0000 | TOPICAL_OINTMENT | CUTANEOUS | Status: DC | PRN

## 2021-07-23 MED ORDER — ONDANSETRON HCL 4 MG/2ML IJ SOLN: 4.0000 mg | INTRAMUSCULAR | Status: DC | PRN

## 2021-07-23 MED ORDER — PRENATAL MULTIVITAMIN CH
1.0000 | ORAL_TABLET | Freq: Every day | ORAL | Status: DC
  Administered 2021-07-23: 1 via ORAL
  Filled 2021-07-23: qty 1

## 2021-07-23 MED ORDER — IBUPROFEN 600 MG PO TABS
600.0000 mg | ORAL_TABLET | Freq: Four times a day (QID) | ORAL | Status: DC
  Administered 2021-07-23 – 2021-07-24 (×5): 600 mg via ORAL
  Filled 2021-07-23 (×5): qty 1

## 2021-07-23 MED ORDER — COCONUT OIL OIL: 1.0000 | TOPICAL_OIL | Status: DC | PRN

## 2021-07-23 MED ORDER — BENZOCAINE-MENTHOL 20-0.5 % EX AERO
1.0000 | INHALATION_SPRAY | CUTANEOUS | Status: DC | PRN
  Administered 2021-07-23: 1 via TOPICAL
  Filled 2021-07-23: qty 56

## 2021-07-23 MED ORDER — ZOLPIDEM TARTRATE 5 MG PO TABS: 5.0000 mg | ORAL_TABLET | Freq: Every evening | ORAL | Status: DC | PRN

## 2021-07-23 MED ORDER — SENNOSIDES-DOCUSATE SODIUM 8.6-50 MG PO TABS
2.0000 | ORAL_TABLET | Freq: Every day | ORAL | Status: DC
  Administered 2021-07-24: 2 via ORAL
  Filled 2021-07-23: qty 2

## 2021-07-23 MED ORDER — DIPHENHYDRAMINE HCL 25 MG PO CAPS: 25.0000 mg | ORAL_CAPSULE | Freq: Four times a day (QID) | ORAL | Status: DC | PRN

## 2021-07-23 MED ORDER — SODIUM CHLORIDE 0.9% FLUSH: 3.0000 mL | INTRAVENOUS | Status: DC | PRN

## 2021-07-23 NOTE — Discharge Summary (Signed)
Obstetrical Discharge Summary  Patient Name: Brandi Higgins DOB: [birth year] MRN: [id number]  Date of Admission: 07/22/2021 Date of Delivery: 07/23/2021 Delivered by: Margaretmary Eddy, CNM  Date of Discharge: 07/24/2021  Primary OB: [facility] FTD:[id number]'Y last menstrual period was 10/20/2020 (exact date). EDC Estimated Date of Delivery: 07/27/21 Gestational Age at Delivery: [redacted]w[redacted]d   Antepartum complications:  Obesity in pregnancy History of preeclampsia  Elevated 1hr GTT   Admitting Diagnosis: Encounter for elective induction of labor [Z34.90]  Secondary Diagnosis: Patient Active Problem List   Diagnosis Date Noted   Encounter for elective induction of labor 07/22/2021   Obesity affecting pregnancy in third trimester 04/14/2021   Supervision of normal pregnancy 01/19/2021   Migraine without aura and without status migrainosus, not intractable 01/04/2014    Discharge Diagnosis: Term Pregnancy Delivered      Induction: AROM, Pitocin, and Cytotec Complications: None Intrapartum complications/course: Brandi Higgins presented to L&D for an elective IOL at term. She was actively managed with misoprostol for cervical ripening and then oxytocin.  She progressed quickly after AROM to C/C/+1 with a strong urge to push.  She pushed effectively over approximately 90 minutes and utilized multiple positions to help with fetal rotation and descent.  Baby remained persistently OP despite attempts at rotation and Brandi Higgins pushed hard for a spontaneous vaginal birth.  Delivery Type: spontaneous vaginal delivery Anesthesia: epidural anesthesia Placenta: spontaneous To Pathology: No  Laceration: 2nd degree perineal and 1st degree right sulcal, repaired with 2-0 vicryl CT  Episiotomy: none Newborn Data: Live born female "Maddox" Birth Weight: 8 lb 11 oz (3941 g) APGAR: 8, 9  Newborn Delivery   Birth date/time: 07/23/2021 02:35:00 Delivery type: Vaginal, Spontaneous      Postpartum  Procedures: none Edinburgh:     07/23/2021    7:50 PM 01/20/2020    7:10 PM  Edinburgh Postnatal Depression Scale Screening Tool  I have been able to laugh and see the funny side of things. 0 0  I have looked forward with enjoyment to things. 0 0  I have blamed myself unnecessarily when things went wrong. 0 0  I have been anxious or worried for no good reason. 0 0  I have felt scared or panicky for no good reason. 0 0  Things have been getting on top of me. 0 0  I have been so unhappy that I have had difficulty sleeping. 0 0  I have felt sad or miserable. 0 0  I have been so unhappy that I have been crying. 0 0  The thought of harming myself has occurred to me. 0 0  Edinburgh Postnatal Depression Scale Total 0 0     Post partum course:   Patient had an uncomplicated postpartum course.  By time of discharge on PPD#1, her pain was controlled on oral pain medications; she had appropriate lochia and was ambulating, voiding without difficulty and tolerating regular diet.  She was deemed stable for discharge to home.     Discharge Physical Exam:   BP 120/90 (BP Location: Right Arm)   Pulse 95   Temp 97.6 F (36.4 C)   Resp 20   Ht 5\' 5"  (1.651 m)   Wt 104.3 kg   LMP 10/20/2020 (Exact Date)   SpO2 99%   Breastfeeding Unknown   BMI 38.27 kg/m   General: NAD CV: RRR Pulm: CTABL, nl effort ABD: s/nd/nt, fundus firm and below the umbilicus Lochia: moderate Perineum: minimal edema/repair well approximated DVT Evaluation: LE non-ttp, no evidence of  DVT on exam.  Hemoglobin  Date Value Ref Range Status  07/24/2021 11.0 (L) 12.0 - 15.0 g/dL Final  63/87/5643 32.9 11.1 - 15.9 g/dL Final   HCT  Date Value Ref Range Status  07/24/2021 33.6 (L) 36.0 - 46.0 % Final   Hematocrit  Date Value Ref Range Status  07/22/2019 39.6 34.0 - 46.6 % Final    Risk assessment for postpartum VTE and prophylactic treatment: Very high risk factors: None High risk factors: None Moderate risk  factors: BMI 30-40 kg/m2  Postpartum VTE prophylaxis with LMWH not indicated  Disposition: stable, discharge to home. Baby Feeding: expressed breast milk and formula  Baby Disposition: home with mom  Rh Immune globulin indicated: No Rubella vaccine given: was not indicated Varivax vaccine given: was not indicated Flu vaccine given in AP setting: N/A Tdap vaccine given in AP setting: Yes   Contraception:  POP's versus vasectomy     Prenatal Labs:  Blood type/Rh A Pos  Antibody screen neg  Rubella Immune  Varicella Immune  RPR NR  HBsAg Neg  HIV NR  GC neg  Chlamydia neg  Genetic screening negative  1 hour GTT 193  3 hour GTT 2 weeks of finger sticks normal  GBS Neg   Plan:  Brandi Higgins was discharged to home in good condition. Follow-up appointment with delivering provider in 6 weeks. BP check this week in the office.  Discharge Medications: Allergies as of 07/24/2021   No Known Allergies      Medication List     STOP taking these medications    hydrochlorothiazide 25 MG tablet Commonly known as: HYDRODIURIL   labetalol 200 MG tablet Commonly known as: NORMODYNE   NIFEdipine 30 MG 24 hr tablet Commonly known as: ADALAT CC   pantoprazole 40 MG tablet Commonly known as: PROTONIX       TAKE these medications    acetaminophen 500 MG tablet Commonly known as: TYLENOL Take 2 tablets (1,000 mg total) by mouth every 6 (six) hours.   benzocaine-Menthol 20-0.5 % Aero Commonly known as: DERMOPLAST Apply 1 Application topically as needed for irritation (perineal discomfort).   ferrous sulfate 325 (65 FE) MG tablet Take 1 tablet (325 mg total) by mouth 2 (two) times daily with a meal.   ibuprofen 600 MG tablet Commonly known as: ADVIL Take 1 tablet (600 mg total) by mouth every 6 (six) hours.   Prenatal 27-1 MG Tabs Take by mouth.   witch hazel-glycerin pad Commonly known as: TUCKS Apply 1 Application topically as needed for hemorrhoids.          Follow-up Information     Gustavo Lah, CNM. Schedule an appointment as soon as possible for a visit in 6 week(s).   Specialty: Certified Nurse Midwife Why: postpartum visit Contact information: [address] [place] [address] [phone]         [facility] Follow up in 3 day(s).   Why: BP check Contact information: [address] [place] [address] [phone]                Signed: Blanchard Kelch 07/24/2021 8:33 AM

## 2021-07-23 NOTE — Progress Notes (Signed)
Labor Progress Note  Brandi Higgins is a 31 y.o. G3P1011 at [redacted]w[redacted]d by LMP admitted for induction of labor due to Elective at term.  Subjective: feeling pressure and the urge to push with contractions   Objective: BP 127/78   Pulse 97   Temp 98 F (36.7 C) (Axillary)   Resp 16   Ht 5\' 5"  (1.651 m)   Wt 104.3 kg   LMP 10/20/2020 (Exact Date)   SpO2 99%   BMI 38.27 kg/m  Notable VS details: reviewed   Fetal Assessment: FHT:  FHR: 140 bpm, variability: moderate,  accelerations:  Present,  decelerations:  Present early and variable decels  Category/reactivity:  Category II UC:   regular, every 2-4 minutes SVE:   10/100/+1 Membrane status: SROM at 2321 Amniotic color: Clear   Labs: Lab Results  Component Value Date   WBC 11.7 (H) 07/22/2021   HGB 11.3 (L) 07/22/2021   HCT 33.5 (L) 07/22/2021   MCV 86.1 07/22/2021   PLT 233 07/22/2021    Assessment / Plan: Induction of labor d/t elective at term - now 2nd stage of labor   Labor:  pushing with contractions, feeling pressure with urge to push but not having effective maternal forces to assist fetal descent.  Will push in a variety of positions to help with rotation and descent.  Consider a pause in pushing and utilize maternal positions if not making significant progress.   Preeclampsia:  BP's remain WNL, labs on admission WNL Fetal Wellbeing:  Category II - overall reassuring with moderate variability  Pain Control:  Epidural I/D:   SROM x 2 hours, afebrile, GBS neg Anticipated MOD:  NSVD  07/24/2021, CNM 07/23/2021, 1:42 AM

## 2021-07-23 NOTE — Discharge Instructions (Signed)
Vaginal Delivery, Care After Refer to this sheet in the next few weeks. These discharge instructions provide you with information on caring for yourself after delivery. Your caregiver may also give you specific instructions. Your treatment has been planned according to the most current medical practices available, but problems sometimes occur. Call your caregiver if you have any problems or questions after you go home. HOME CARE INSTRUCTIONS Take over-the-counter or prescription medicines only as directed by your caregiver or pharmacist. Do not drink alcohol, especially if you are breastfeeding or taking medicine to relieve pain. Do not smoke tobacco. Continue to use good perineal care. Good perineal care includes: Wiping your perineum from back to front Keeping your perineum clean. You can do sitz baths twice a day, to help keep this area clean Do not use tampons, douche or have sex until your caregiver says it is okay. Shower only and avoid sitting in submerged water, aside from sitz baths Wear a well-fitting bra that provides breast support. Eat healthy foods. Drink enough fluids to keep your urine clear or pale yellow. Eat high-fiber foods such as whole grain cereals and breads, brown rice, beans, and fresh fruits and vegetables every day. These foods may help prevent or relieve constipation. Avoid constipation with high fiber foods or medications, such as miralax or metamucil Follow your caregiver's recommendations regarding resumption of activities such as climbing stairs, driving, lifting, exercising, or traveling. Talk to your caregiver about resuming sexual activities. Resumption of sexual activities is dependent upon your risk of infection, your rate of healing, and your comfort and desire to resume sexual activity. Try to have someone help you with your household activities and your newborn for at least a few days after you leave the hospital. Rest as much as possible. Try to rest or  take a nap when your newborn is sleeping. Increase your activities gradually. Keep all of your scheduled postpartum appointments. It is very important to keep your scheduled follow-up appointments. At these appointments, your caregiver will be checking to make sure that you are healing physically and emotionally. SEEK MEDICAL CARE IF:  You are passing large clots from your vagina. Save any clots to show your caregiver. You have a foul smelling discharge from your vagina. You have trouble urinating. You are urinating frequently. You have pain when you urinate. You have a change in your bowel movements. You have increasing redness, pain, or swelling near your vaginal incision (episiotomy) or vaginal tear. You have pus draining from your episiotomy or vaginal tear. Your episiotomy or vaginal tear is separating. You have painful, hard, or reddened breasts. You have a severe headache. You have blurred vision or see spots. You feel sad or depressed. You have thoughts of hurting yourself or your newborn. You have questions about your care, the care of your newborn, or medicines. You are dizzy or light-headed. You have a rash. You have nausea or vomiting. You were breastfeeding and have not had a menstrual period within 12 weeks after you stopped breastfeeding. You are not breastfeeding and have not had a menstrual period by the 12th week after delivery. You have a fever. SEEK IMMEDIATE MEDICAL CARE IF:  You have persistent pain. You have chest pain. You have shortness of breath. You faint. You have leg pain. You have stomach pain. Your vaginal bleeding saturates two or more sanitary pads in 1 hour. MAKE SURE YOU:  Understand these instructions. Will watch your condition. Will get help right away if you are not doing well or   get worse. Document Released: 12/16/1999 Document Revised: 05/04/2013 Document Reviewed: 08/15/2011 ExitCare Patient Information 2015 [employer]. This  information is not intended to replace advice given to you by your health care provider. Make sure you discuss any questions you have with your health care provider.  Sitz Bath A sitz bath is a warm water bath taken in the sitting position. The water covers only the hips and butt (buttocks). We recommend using one that fits in the toilet, to help with ease of use and cleanliness. It may be used for either healing or cleaning purposes. Sitz baths are also used to relieve pain, itching, or muscle tightening (spasms). The water may contain medicine. Moist heat will help you heal and relax.  HOME CARE  Take 3 to 4 sitz baths a day. Fill the bathtub half-full with warm water. Sit in the water and open the drain a little. Turn on the warm water to keep the tub half-full. Keep the water running constantly. Soak in the water for 15 to 20 minutes. After the sitz bath, pat the affected area dry. GET HELP RIGHT AWAY IF: You get worse instead of better. Stop the sitz baths if you get worse. MAKE SURE YOU: Understand these instructions. Will watch your condition. Will get help right away if you are not doing well or get worse. Document Released: 01/26/2004 Document Revised: 09/12/2011 Document Reviewed: 04/17/2010 ExitCare Patient Information 2015 [employer]. This information is not intended to replace advice given to you by your health care provider. Make sure you discuss any questions you have with your health care provider.   

## 2021-07-23 NOTE — Lactation Note (Signed)
This note was copied from a baby's chart. Lactation Consultation Note  Patient Name: Brandi Higgins NVBTY'O Date: 07/23/2021 Reason for consult: Initial assessment;Term;Other (Comment) (c-section; pump set-up) Age:33 hours  Maternal Data Has patient been taught Hand Expression?: Yes Does the patient have breastfeeding experience prior to this delivery?: Yes How long did the patient breastfeed?: 1yr  Mom is P2, SVD 8 hours ago. Plans to pump and bottle feed; providing formula until supply is established. Mom pumped and bottle fed with first child (1.5yrs old), and reports a very ample supply   Feeding Mother's Current Feeding Choice: Breast Milk and Formula Nipple Type: Slow - flow  Mom is providing formula until able to express appropriate volumes of breastmilk.  LATCH Score   Mom is not putting baby to the breast.   Lactation Tools Discussed/Used Tools: Pump Breast pump type: Double-Electric Breast Pump Pump Education: Setup, frequency, and cleaning;Milk Storage Reason for Pumping: mom's choice to pump and bottle feed Pumping frequency: q 3 hours  Mom's choice to pump+Bottle feed  Interventions Interventions: Breast feeding basics reviewed;Hand express;DEBP;Education  Discharge Pump: Personal;Hands Free (Medela PumpNStyle, and Medela Flex)  Consult Status Consult Status: PRN  Whiteboard updated; encouraged to call for support as needed.  Danford Bad 07/23/2021, 11:31 AM

## 2021-07-23 NOTE — Progress Notes (Signed)
Postpartum Day  0  Subjective: no complaints, up ad lib, voiding, and tolerating PO  Doing well, no concerns. Ambulating without difficulty, pain managed with PO meds, tolerating regular diet, and voiding without difficulty.   No fever/chills, chest pain, shortness of breath, nausea/vomiting, or leg pain. No nipple or breast pain. No headache, visual changes, or RUQ/epigastric pain.  Objective: BP 122/83 (BP Location: Right Arm)   Pulse 100   Temp 98.5 F (36.9 C) (Oral)   Resp 18   Ht 5\' 5"  (1.651 m)   Wt 104.3 kg   LMP 10/20/2020 (Exact Date)   SpO2 99% Comment: Room Air  Breastfeeding Unknown   BMI 38.27 kg/m    Physical Exam:  General: alert, cooperative, and no distress Breasts: soft/nontender CV: RRR Pulm: nl effort, CTABL Abdomen: soft, non-tender, active bowel sounds Uterine Fundus: firm Perineum: minimal edema, repair well approximated Lochia: appropriate DVT Evaluation: No evidence of DVT seen on physical exam.  Recent Labs    07/22/21 1313 07/23/21 0702  HGB 11.3* 10.8*  HCT 33.5* 32.3*  WBC 11.7* 21.3*  PLT 233 215    Assessment/Plan: 31 y.o. 32 postpartum day # 0  -Continue routine postpartum care -Lactation consult PRN for breastfeeding - exclusively pumping  -Acute blood loss anemia - hemodynamically stable and asymptomatic; start PO ferrous sulfate BID with stool softeners  -Will check CBC in AM to assess for changes >24 hours from delivery  -Immunization status:   all immunizations up to date -One mild range BP noted postpartum, denies s/s of preeclampsia, will continue to monitor BP's   Disposition: Continue inpatient postpartum care    LOS: 1 day   [id number], CNM 07/23/2021, 12:06 PM   ----- 07/25/2021  Certified Nurse Midwife [facility]

## 2021-07-24 LAB — CBC
HCT: 33.6 % — ABNORMAL LOW (ref 36.0–46.0)
Hemoglobin: 11 g/dL — ABNORMAL LOW (ref 12.0–15.0)
MCH: 29.3 pg (ref 26.0–34.0)
MCHC: 32.7 g/dL (ref 30.0–36.0)
MCV: 89.4 fL (ref 80.0–100.0)
Platelets: 204 10*3/uL (ref 150–400)
RBC: 3.76 MIL/uL — ABNORMAL LOW (ref 3.87–5.11)
RDW: 14.5 % (ref 11.5–15.5)
WBC: 13.2 10*3/uL — ABNORMAL HIGH (ref 4.0–10.5)
nRBC: 0 % (ref 0.0–0.2)

## 2021-07-24 MED ORDER — IBUPROFEN 600 MG PO TABS: 600.0000 mg | ORAL_TABLET | Freq: Four times a day (QID) | ORAL | 0 refills | Status: DC

## 2021-07-24 MED ORDER — FERROUS SULFATE 325 (65 FE) MG PO TABS: 325.0000 mg | ORAL_TABLET | Freq: Two times a day (BID) | ORAL | 3 refills | Status: AC

## 2021-07-24 MED ORDER — ACETAMINOPHEN 500 MG PO TABS: 1000.0000 mg | ORAL_TABLET | Freq: Four times a day (QID) | ORAL | 0 refills | Status: AC

## 2021-07-24 MED ORDER — BENZOCAINE-MENTHOL 20-0.5 % EX AERO: 1.0000 | INHALATION_SPRAY | CUTANEOUS | Status: AC | PRN

## 2021-07-24 MED ORDER — WITCH HAZEL-GLYCERIN EX PADS: 1.0000 | MEDICATED_PAD | CUTANEOUS | 12 refills | Status: AC | PRN

## 2021-07-24 NOTE — Anesthesia Postprocedure Evaluation (Signed)
Anesthesia Post Note  Patient: CALLEE ROHRIG  Procedure(s) Performed: AN AD HOC LABOR EPIDURAL  Patient location during evaluation: Mother Baby Anesthesia Type: Epidural Level of consciousness: awake, oriented and awake and alert Pain management: pain level controlled Vital Signs Assessment: post-procedure vital signs reviewed and stable Respiratory status: spontaneous breathing, respiratory function stable and nonlabored ventilation Cardiovascular status: blood pressure returned to baseline and stable Postop Assessment: no headache and no backache Anesthetic complications: no   No notable events documented.   Last Vitals:  Vitals:   07/23/21 2252 07/24/21 0431  BP: 128/85 (!) 95/54  Pulse: 98 86  Resp: 18 18  Temp: 37.1 C 36.6 C  SpO2: 100% 100%    Last Pain:  Vitals:   07/24/21 0545  TempSrc:   PainSc: 0-No pain                 Ginger Carne

## 2021-11-23 IMAGING — US US OB < 14 WEEKS - US OB TV
1 series · 13 of 28 positions shown · non-contrast
Comparison: None.

CLINICAL DATA: Vaginal bleeding with positive beta HCG examination.

EXAM:
OBSTETRIC <14 WK US AND TRANSVAGINAL OB US
TECHNIQUE: Both transabdominal and transvaginal ultrasound examinations were
performed for complete evaluation of the gestation as well as the
maternal uterus, adnexal regions, and pelvic cul-de-sac.
Transvaginal technique was performed to assess early pregnancy.

[Series 1: us ob < 14 weeks - us ob tv · 0.19mm/px · 13 of 116 slices shown]
[im 5/116]
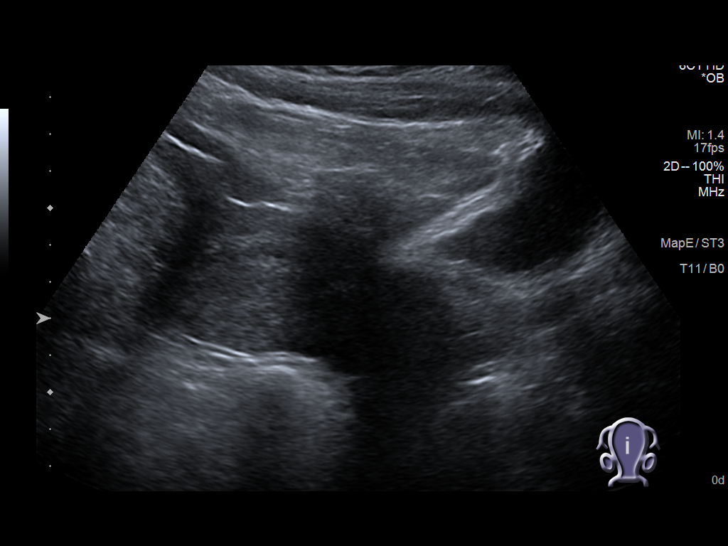
[im 13/116]
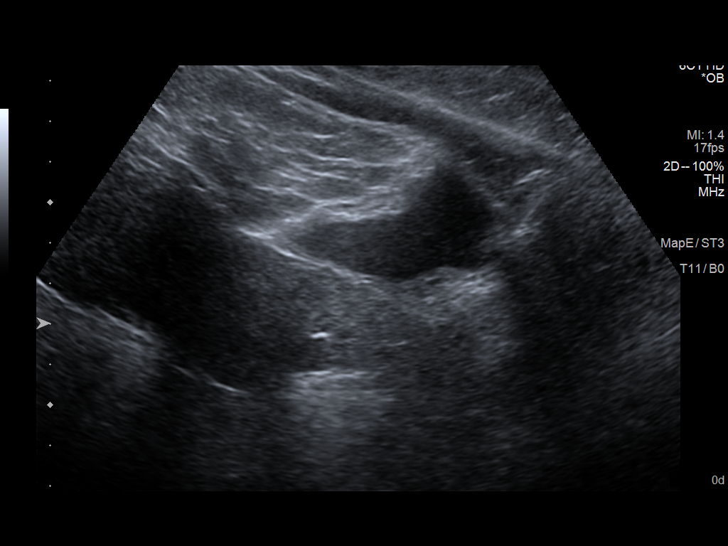
[im 22/116]
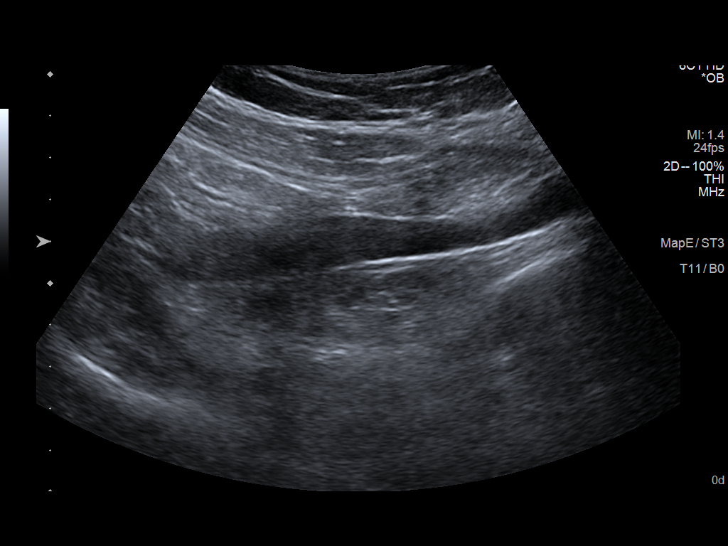
[im 30/116]
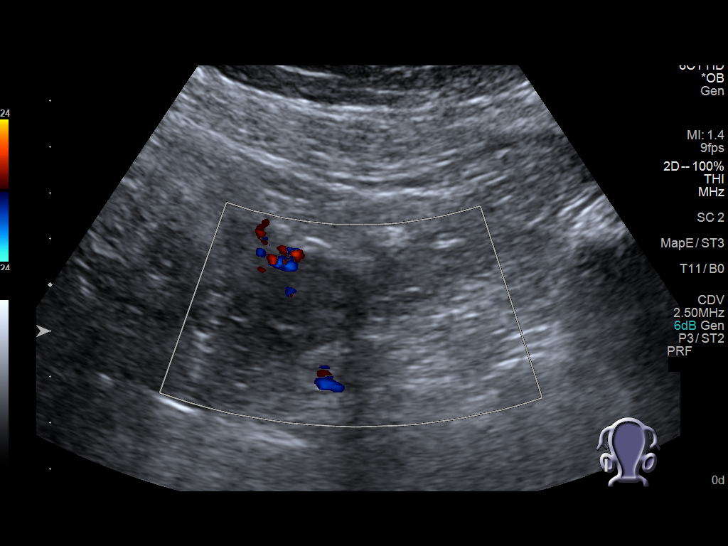
[im 39/116]
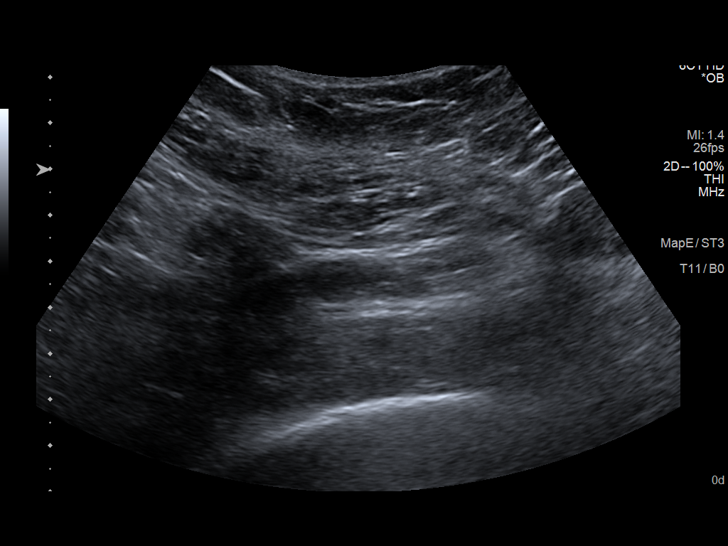
[im 47/116]
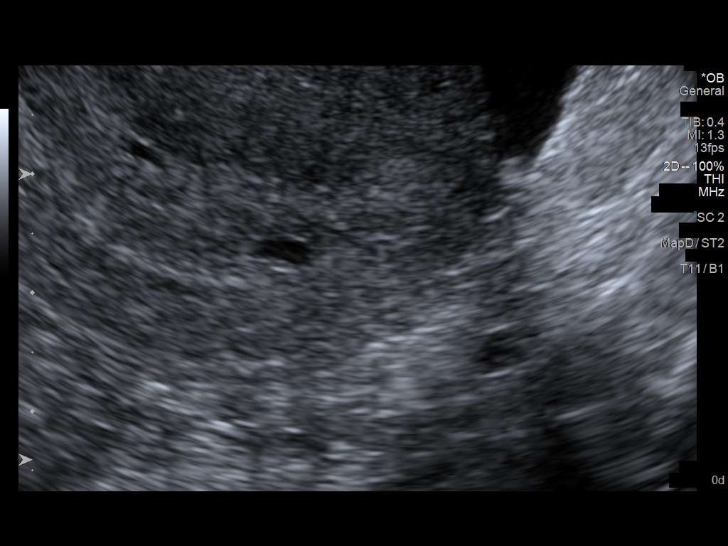
[im 60/116]
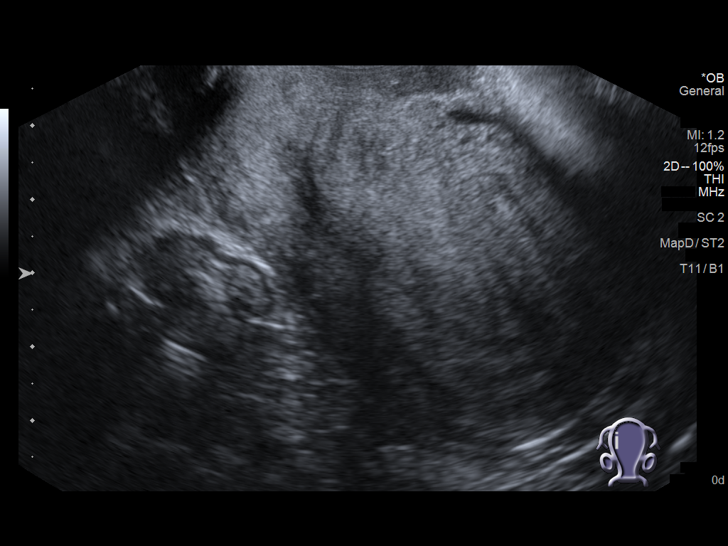
[im 69/116]
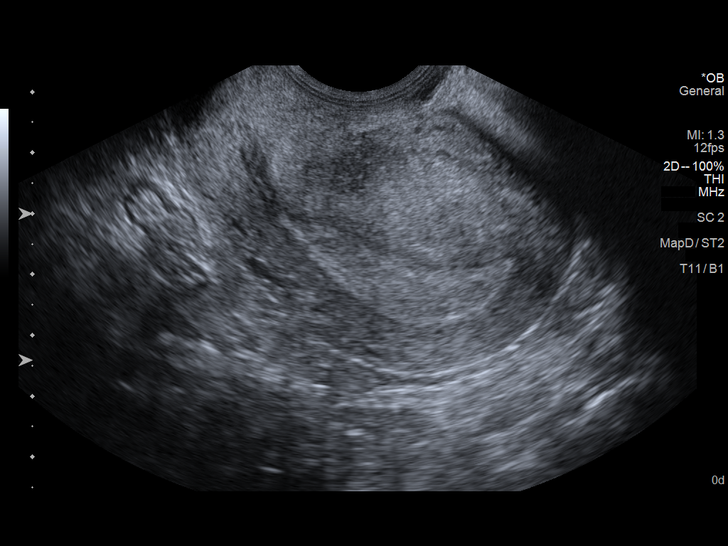
[im 77/116]
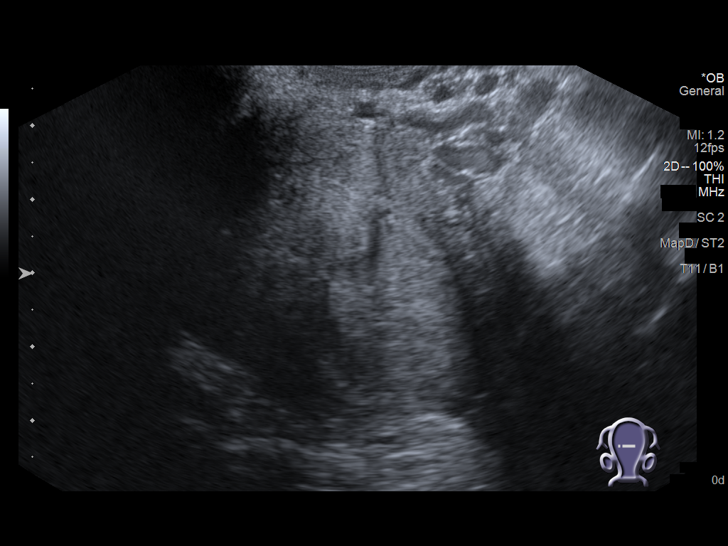
[im 86/116]
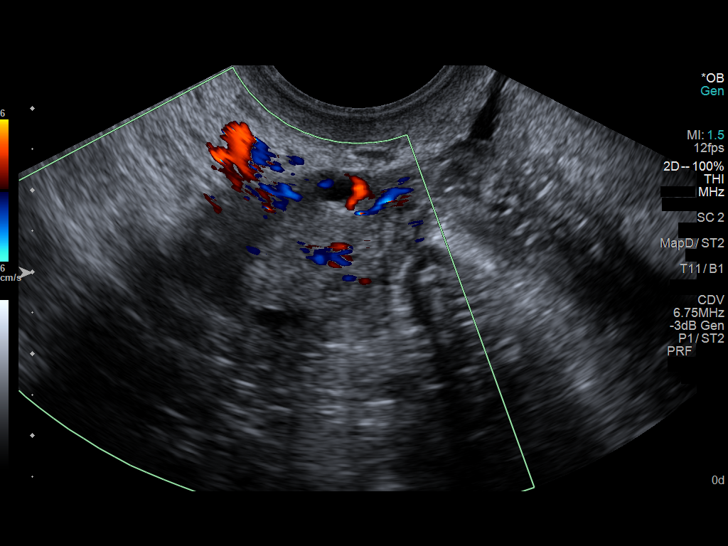
[im 94/116]
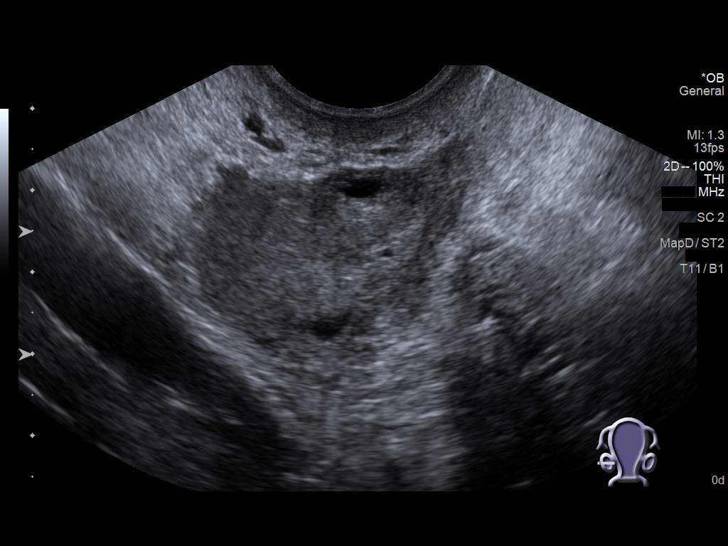
[im 103/116]
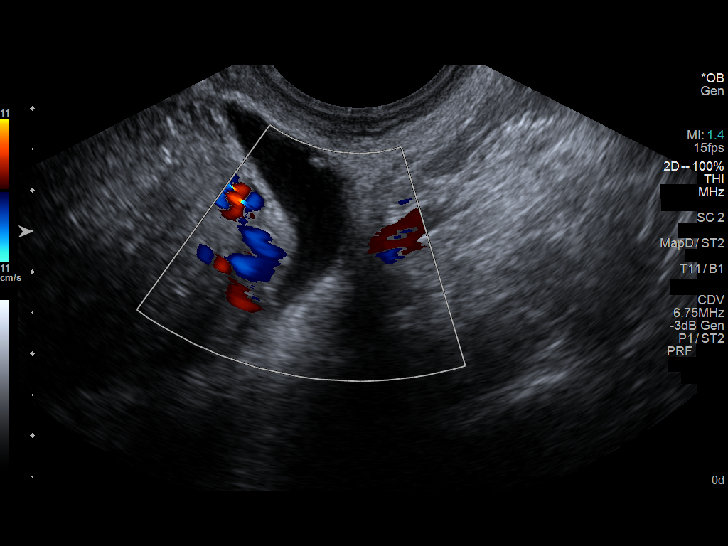
[im 111/116]
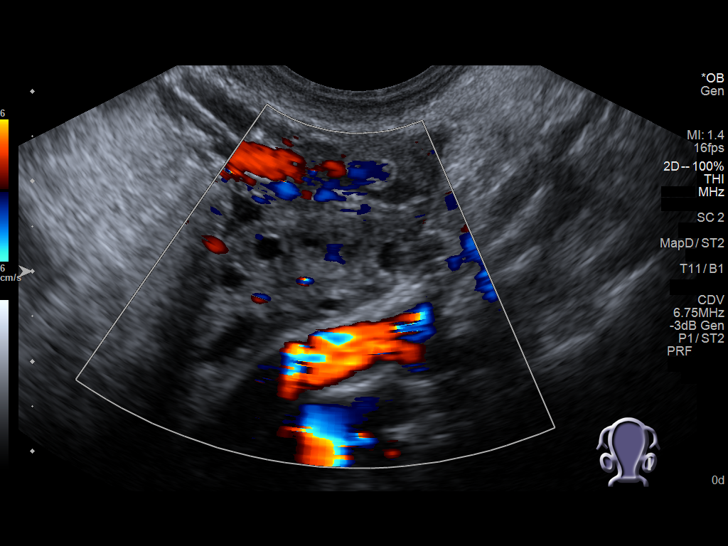

[13 of 28 positions shown; findings below may reference images not displayed]

FINDINGS: Intrauterine gestational sac: Visualized

Yolk sac:  Not visualized

Embryo:  Not visualized

Cardiac Activity: Not visualized

MSD: 3 mm   5 w   0 d

Subchorionic hemorrhage:  None visualized.

Maternal uterus/adnexae: Cervical os is closed. Left ovary measures
2.9 x 1.2 x 2.1 cm. Right ovary measures 3.2 x 2.3 x 3.1 cm. Small
corpus luteum on the right. No other extrauterine pelvic mass. Small
amount of free pelvic fluid noted toward the left.
IMPRESSION: Probable early intrauterine gestational sac, but no yolk sac, fetal
pole, or cardiac activity yet visualized. Recommend follow-up
quantitative B-HCG levels and follow-up US in 14 days to assess
viability. This recommendation follows SRU consensus guidelines:
Diagnostic Criteria for Nonviable Pregnancy Early in the First
Trimester. N Engl J Med 6940; [DATE]. based on gestational sac
size, estimated gestational age is approximately 5 weeks. No
subchorionic hemorrhage evident.

Small amount of fluid in the leftward pelvis may indicate recent
cyst rupture.

## 2021-11-27 IMAGING — US US OB TRANSVAGINAL
1 series · 15 of 28 positions shown · non-contrast
Comparison: 03/30/2019

CLINICAL DATA: RIGHT lower quadrant pain and spotting, pregnant,
LMP 02/02/2019

EXAM:
TRANSVAGINAL OB ULTRASOUND
TECHNIQUE: Transvaginal ultrasound was performed for complete evaluation of the
gestation as well as the maternal uterus, adnexal regions, and
pelvic cul-de-sac.

[Series 1: us ob transvaginal · 15 of 38 slices shown]
[im 1/38]
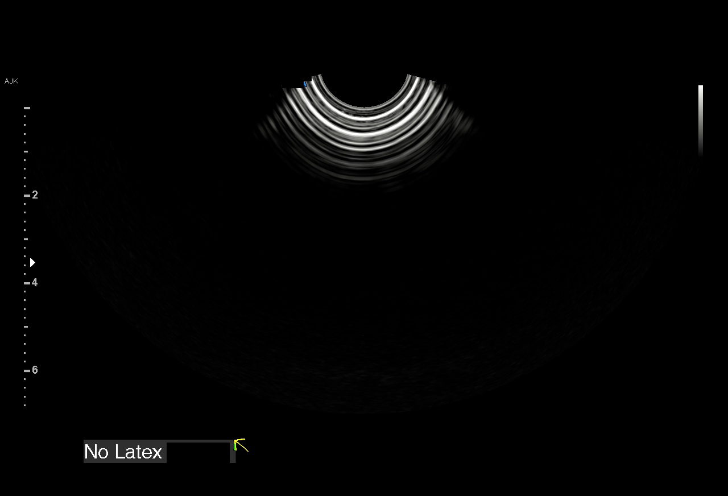
[im 3/38]
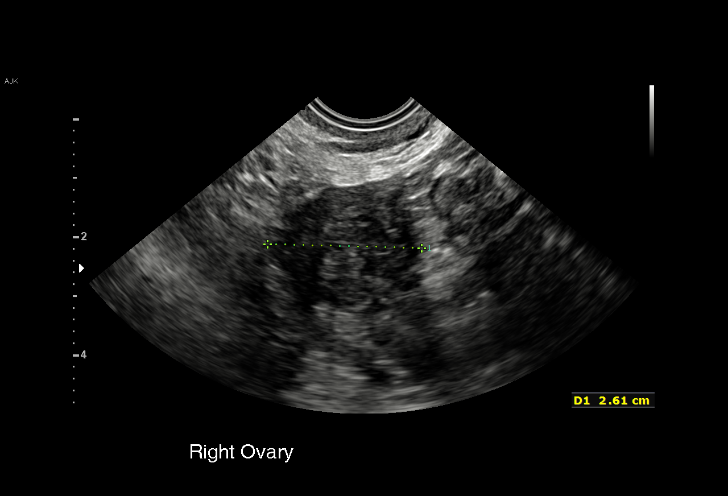
[im 6/38]
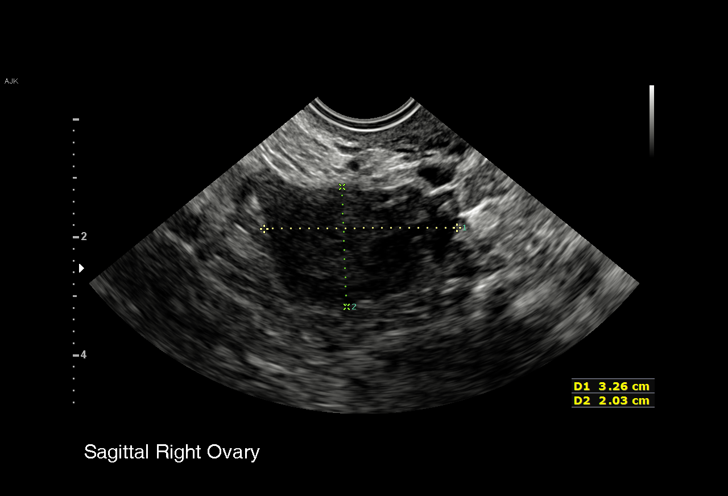
[im 9/38]
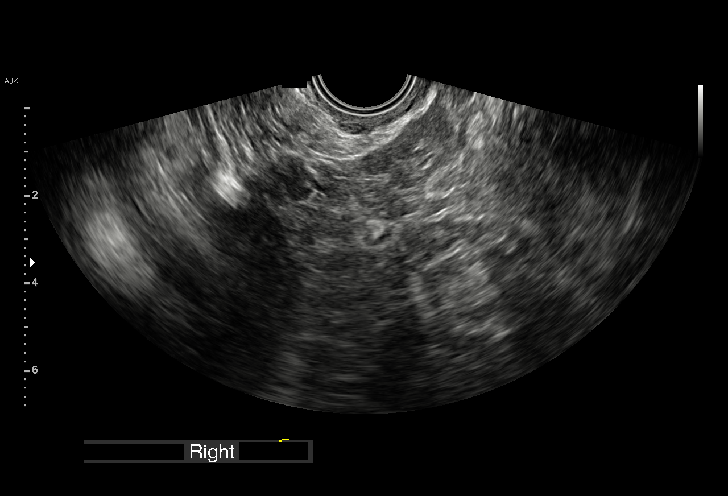
[im 11/38]
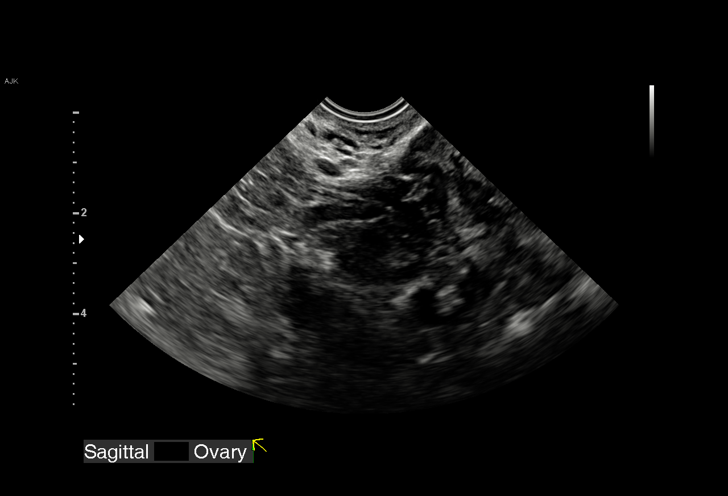
[im 14/38]
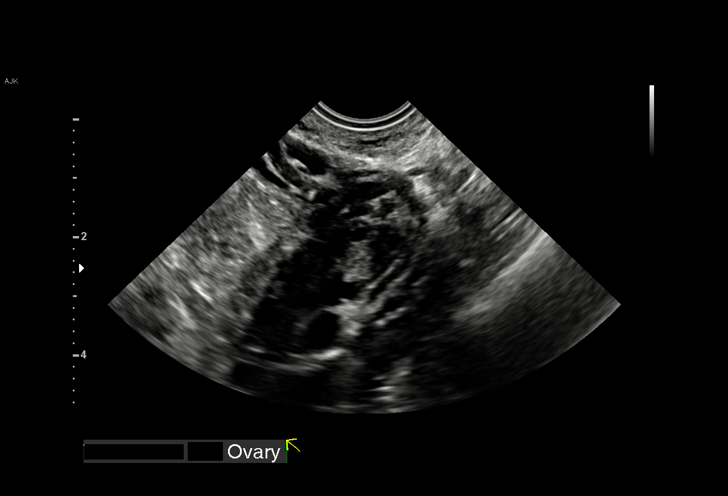
[im 17/38]
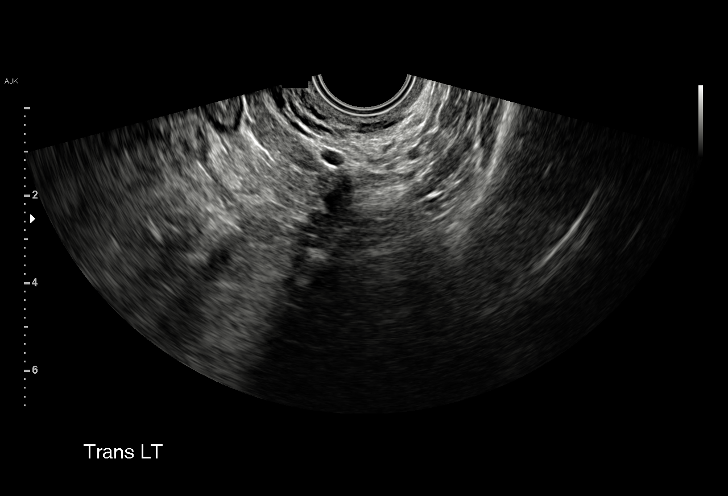
[im 20/38]
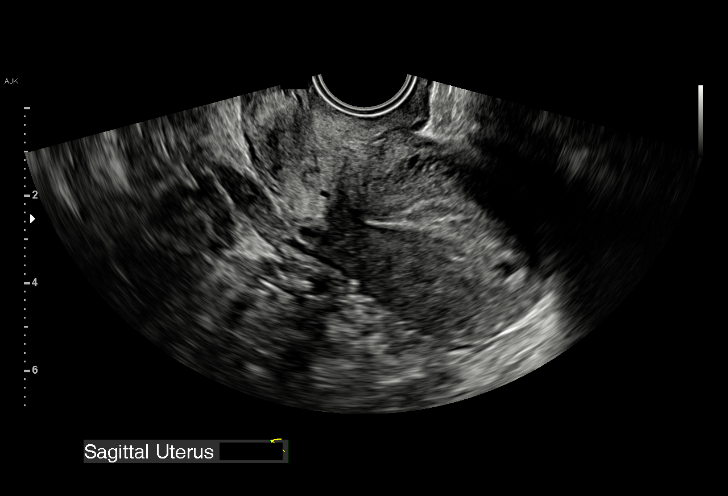
[im 21/38]
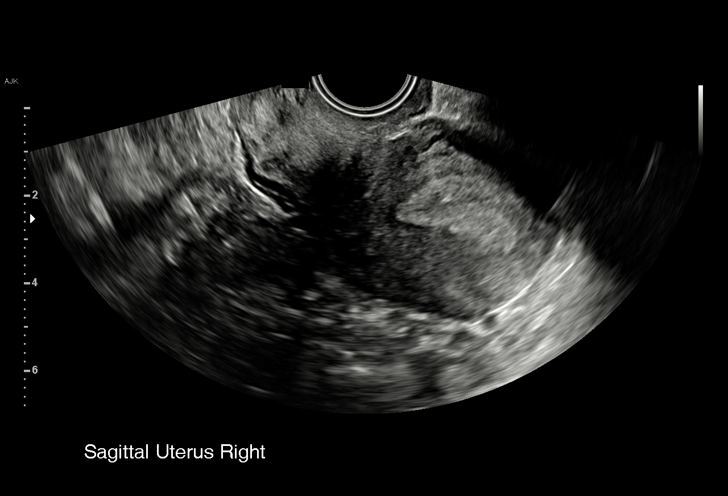
[im 24/38]
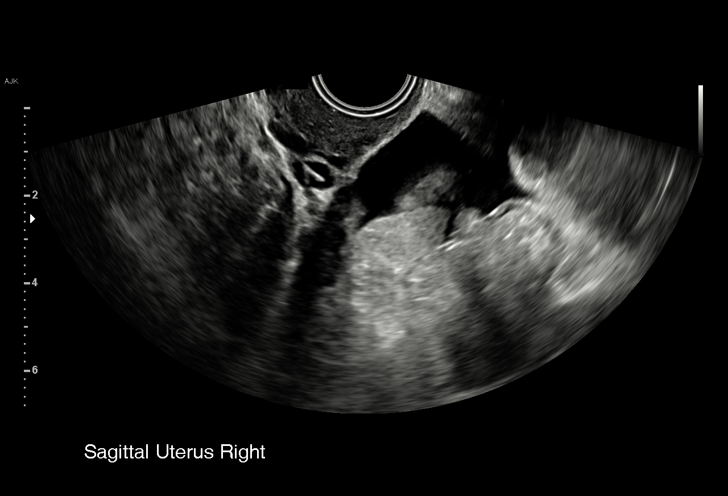
[im 27/38]
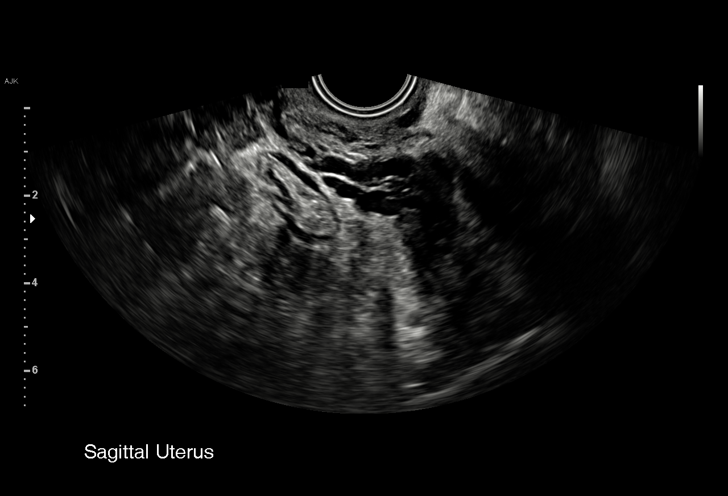
[im 29/38]
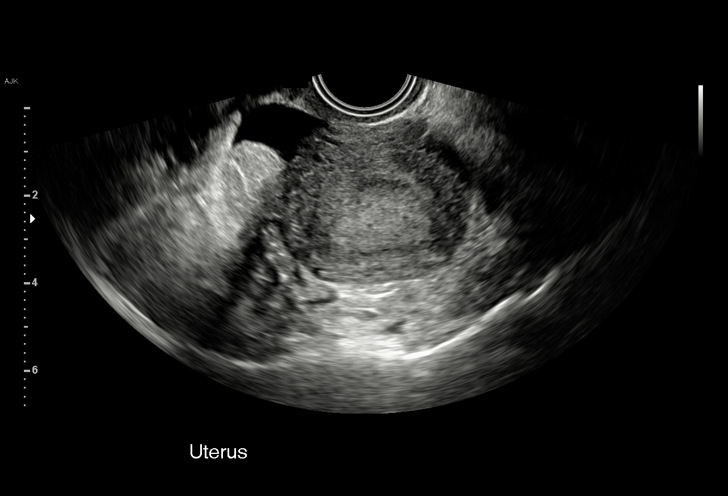
[im 32/38]
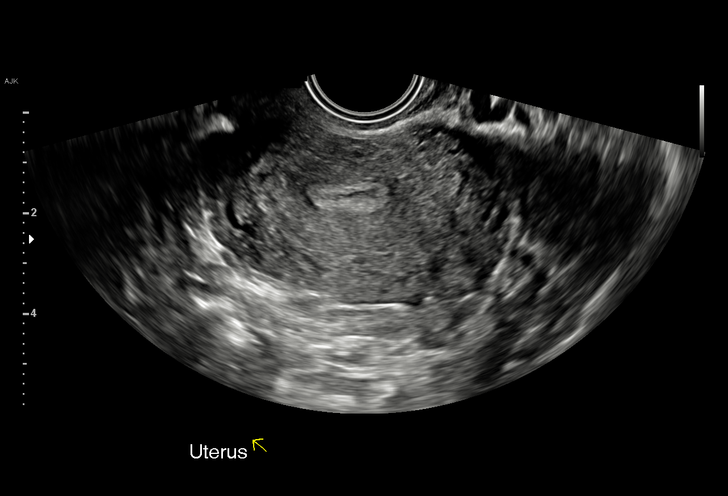
[im 35/38]
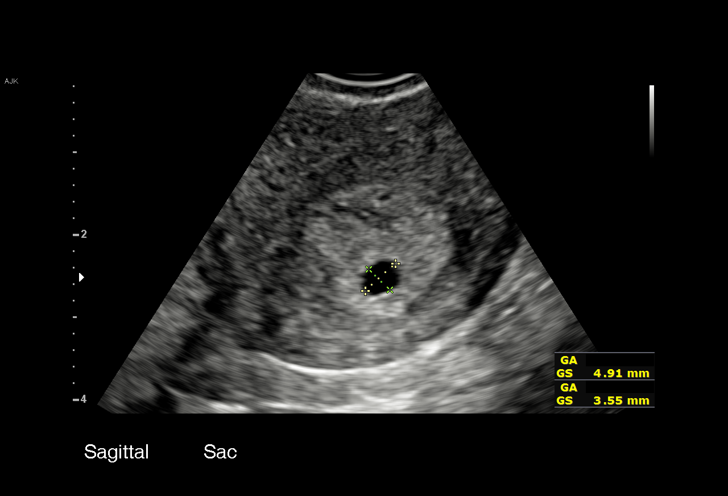
[im 38/38]
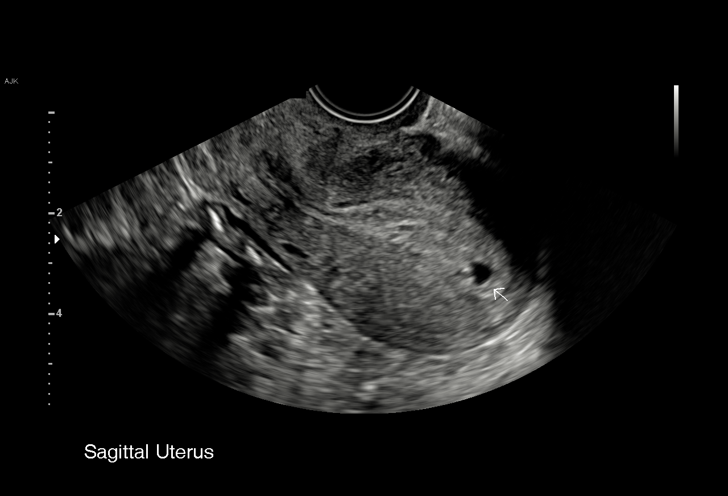

[15 of 28 positions shown; findings below may reference images not displayed]

FINDINGS: Intrauterine gestational sac: Visualized

Yolk sac:  Not identified

Embryo:  Not identified

Cardiac Activity: N/A

Heart Rate: N/A bpm

MSD: 4.0 mm   5 w   0 d

Subchorionic hemorrhage:  None visualize

Maternal uterus/adnexae:

Retroverted uterus.

No uterine mass or additional focal abnormality.

RIGHT ovary normal size and morphology 3.3 x 2.0 x 2.6 cm

LEFT ovary normal size and morphology 2.1 x 1.5 x 1.8 cm.

Small amount of free pelvic fluid.

No adnexal masses.
IMPRESSION: Small gestational sac identified within uterus, size little changed
since 03/30/2019.

No yolk sac or fetal pole identified to establish viability.

Small amount of free pelvic fluid, unchanged.

Follow-up ultrasound recommended in 14 days to determine viability.

## 2021-12-05 IMAGING — US US OB TRANSVAGINAL
1 series · 15 of 28 positions shown · non-contrast
Comparison: 04/03/2019

CLINICAL DATA: Vaginal bleeding in early pregnancy.

EXAM:
TRANSVAGINAL OB ULTRASOUND
TECHNIQUE: Transvaginal ultrasound was performed for complete evaluation of the
gestation as well as the maternal uterus, adnexal regions, and
pelvic cul-de-sac.

[Series 1: us ob transvaginal · 15 of 30 slices shown]
[im 1/30]
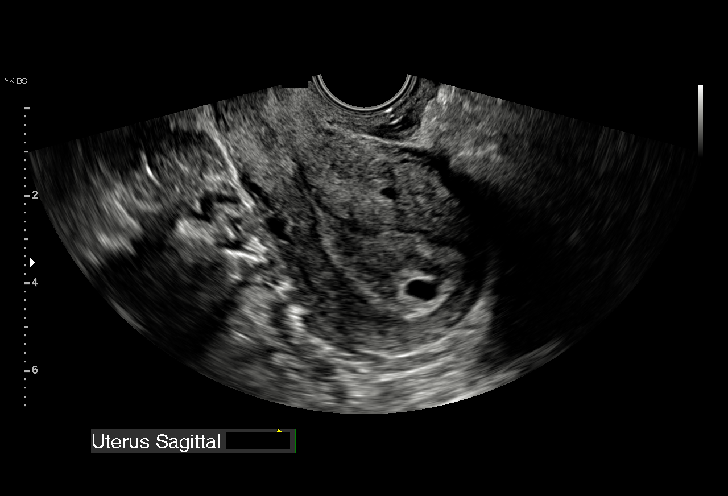
[im 3/30]
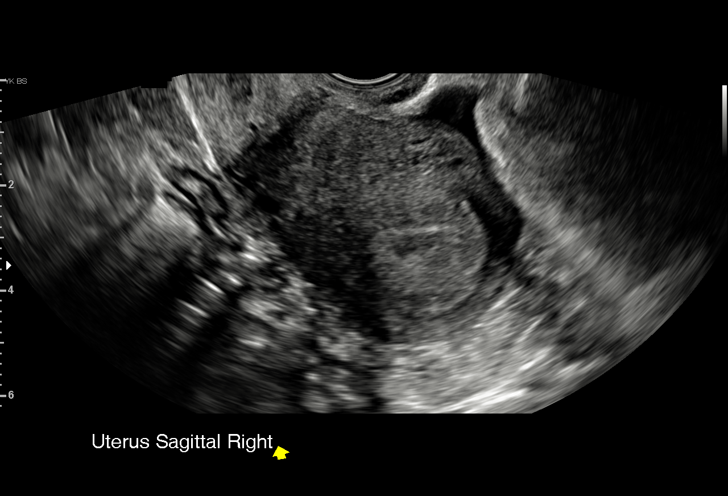
[im 5/30]
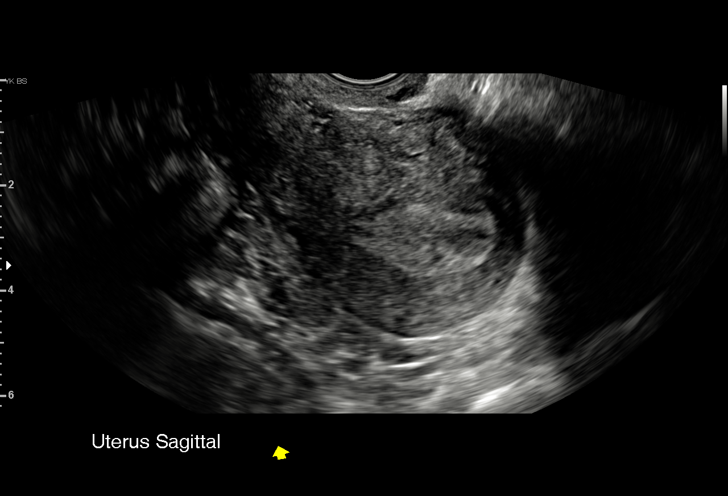
[im 7/30]
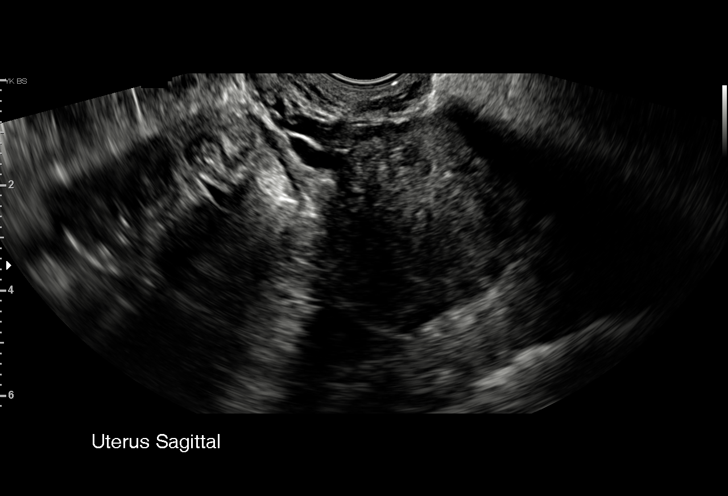
[im 9/30]
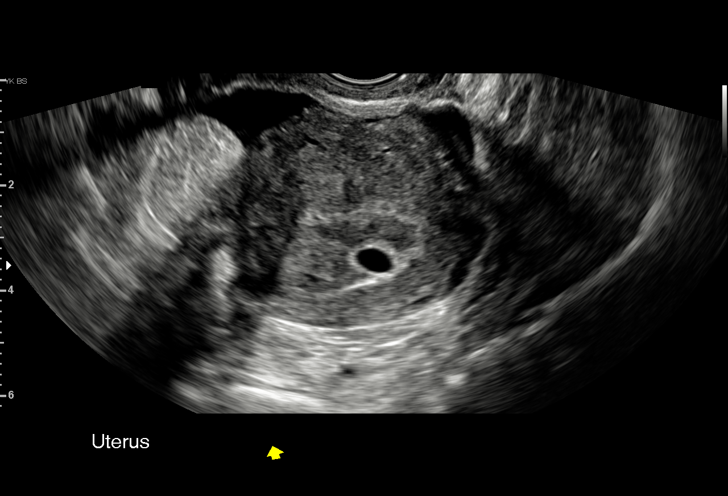
[im 11/30]
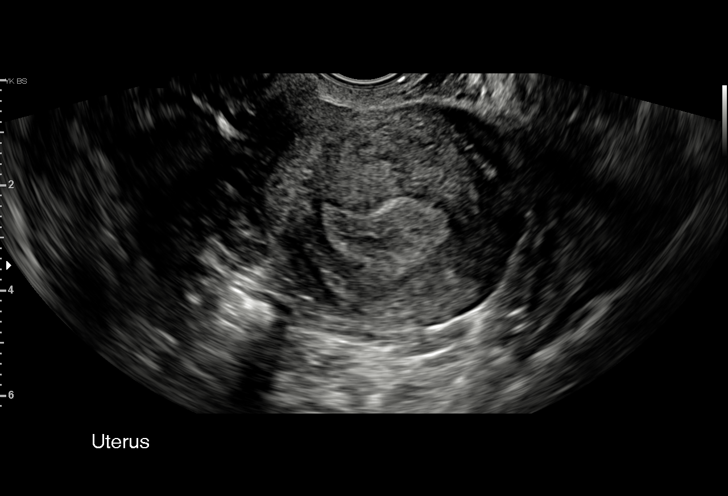
[im 13/30]
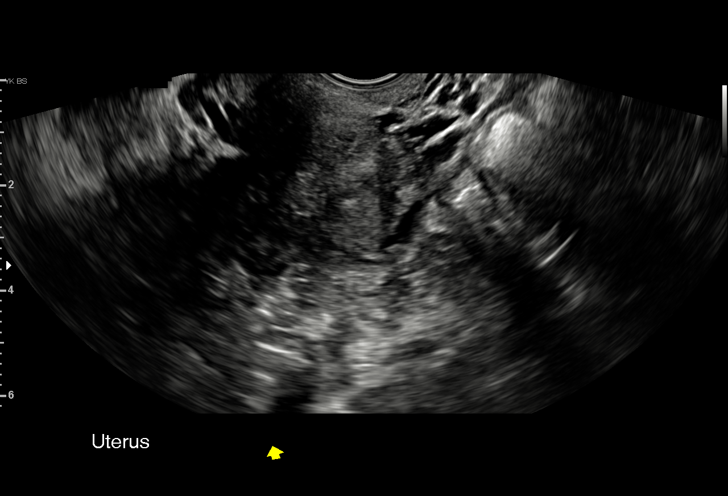
[im 16/30]
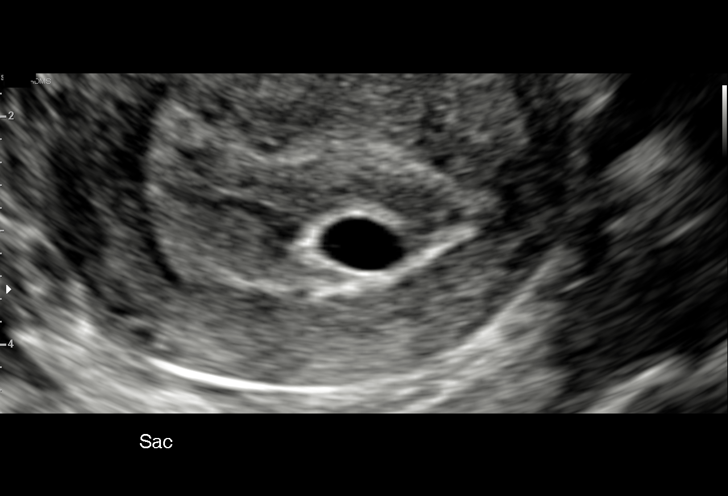
[im 17/30]
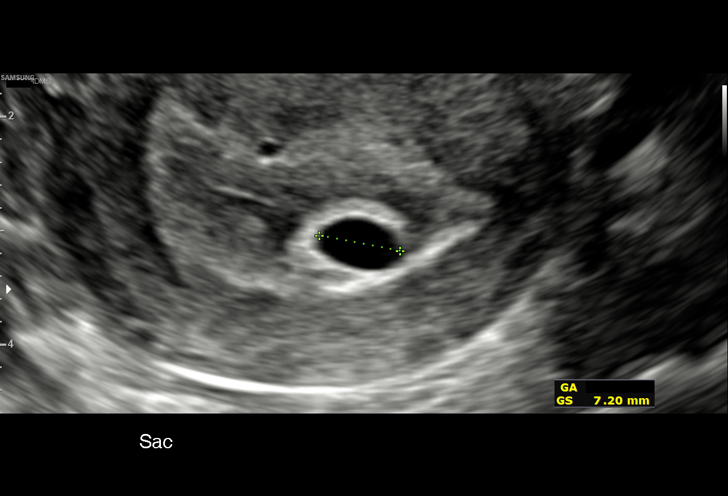
[im 19/30]
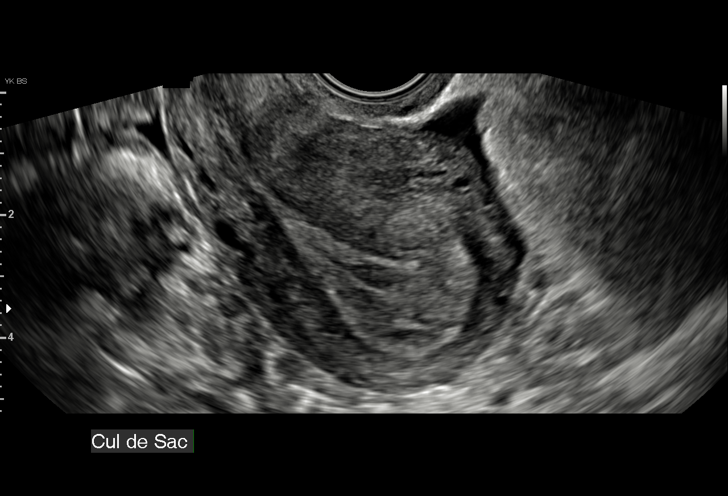
[im 21/30]
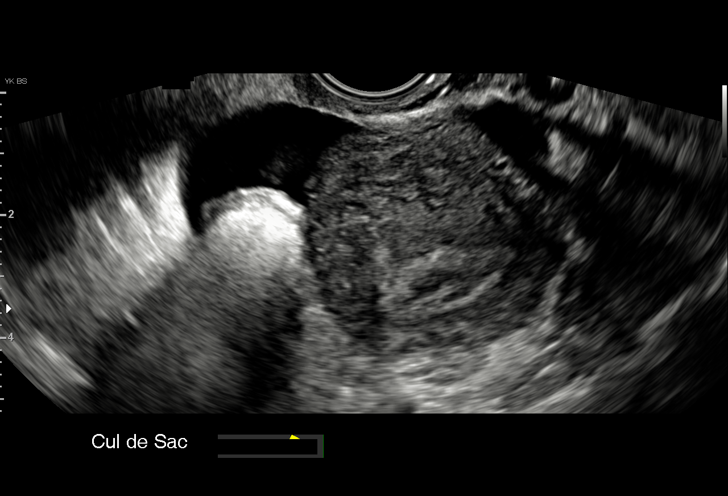
[im 23/30]
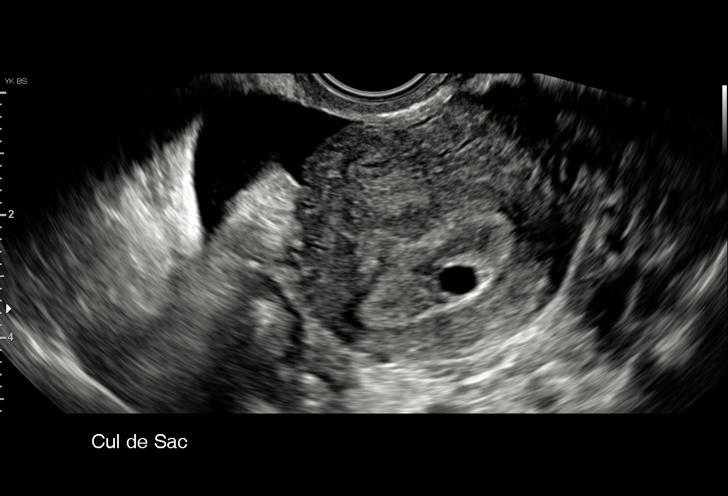
[im 25/30]
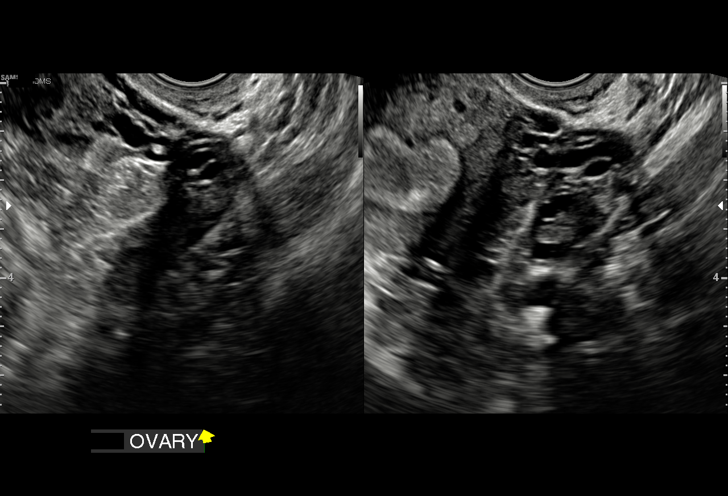
[im 27/30]
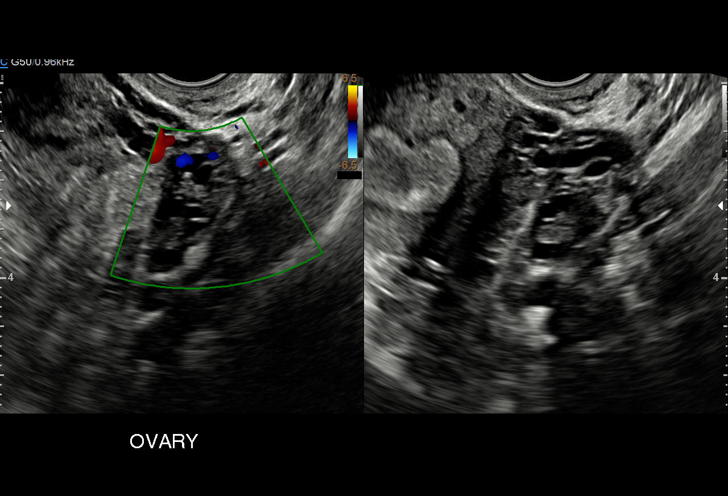
[im 30/30]
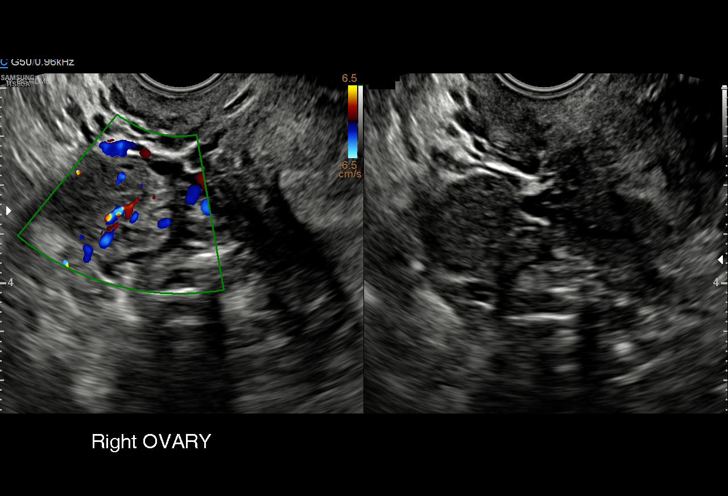

[15 of 28 positions shown; findings below may reference images not displayed]

FINDINGS: Intrauterine gestational sac: Cystic area with surrounding decidual
reaction as before. Potentially gestational sac based on appearance
but without yolk sac or embryo.

Yolk sac:  Not visualized

Embryo:  Not visualized

MSD: 6.5 mm   5 w   2 d

Subchorionic hemorrhage:  None visualized.

Maternal uterus/adnexae: Normal appearance of the uterus and adnexa
otherwise. With small volume fluid in the cul-de-sac with similar
appearance.
IMPRESSION: Potential intrauterine gestational sac at the uterine fundus shows
little if any change over 8 days and does not display evidence of
embryo or yolk sac. Continued follow-up is suggested to ensure
confirm pregnancy location. Serial beta HCG and endovaginal
sonography may be helpful in 7-10 days or as warranted.

## 2022-02-06 ENCOUNTER — Ambulatory Visit: Payer: 59 | Admitting: Podiatry

## 2022-02-06 ENCOUNTER — Encounter: Payer: Self-pay | Admitting: Podiatry

## 2022-02-06 ENCOUNTER — Ambulatory Visit (INDEPENDENT_AMBULATORY_CARE_PROVIDER_SITE_OTHER): Payer: 59 | Admitting: Podiatry

## 2022-02-06 ENCOUNTER — Ambulatory Visit (INDEPENDENT_AMBULATORY_CARE_PROVIDER_SITE_OTHER): Payer: 59

## 2022-02-06 VITALS — BP 130/78 | HR 85

## 2022-02-06 DIAGNOSIS — M722 Plantar fascial fibromatosis: Secondary | ICD-10-CM | POA: Diagnosis not present

## 2022-02-06 MED ORDER — METHYLPREDNISOLONE 4 MG PO TBPK: ORAL_TABLET | ORAL | 0 refills | Status: AC

## 2022-02-06 MED ORDER — IBUPROFEN 800 MG PO TABS: 800.0000 mg | ORAL_TABLET | Freq: Three times a day (TID) | ORAL | 1 refills | Status: AC | PRN

## 2022-02-06 NOTE — Progress Notes (Signed)
   Chief Complaint  Patient presents with   Foot Pain    "I need my feet taped for Plantar Fasciitis." N - heel pain L - bilateral D - 1 week O - suddenly C - sharp pain, burns A - walking,  T - none, I normally tape them, I usually see Dr Elvina Mattes at [place]: 32 y.o. female presenting today as a new patient for evaluation of bilateral heel pain.  Patient believes that she has plantar fasciitis.  She has had plantar fasciitis in the past and traditionally has seen Dr. Elvina Mattes at [facility] [place] [facility].  She states that he would taper her feet and prescribe an anti-inflammatory which seem to resolve her symptoms.  She presents today for further treatment evaluation   Past Medical History:  Diagnosis Date   Abnormal Pap smear of vagina    Gestational diabetes    Heart murmur    History of ectopic pregnancy    Hypertension    Migraines    Swelling      Objective: Physical Exam General: The patient is alert and oriented x3 in no acute distress.  Dermatology: Skin is warm, dry and supple bilateral lower extremities. Negative for open lesions or macerations bilateral.   Vascular: Dorsalis Pedis and Posterior Tibial pulses palpable bilateral.  Capillary fill time is immediate to all digits.  Neurological: Epicritic and protective threshold intact bilateral.   Musculoskeletal: Tenderness to palpation to the plantar aspect of the bilateral heels along the plantar fascia. All other joints range of motion within normal limits bilateral. Strength 5/5 in all groups bilateral.   Radiographic exam B/L feet 02/06/2022: Normal osseous mineralization. Joint spaces preserved. No fracture/dislocation/boney destruction. No other soft tissue abnormalities or radiopaque foreign bodies.   Assessment/plan of care: 1. plantar fasciitis bilateral feet -Patient evaluated.  X-rays reviewed -In the past the patient has traditionally had the plantar aspect of her feet taped and oral NSAIDs  prescribed.  We no longer offer this service of taping in our practice. -Plantar fascial brace was dispensed.  Wear daily with good supportive tennis shoes and sneakers -Prescription for Medrol Dosepak -Prescription for Motrin 800 mg TID -RTC as needed  *Dental assistant at [place] here in our [place] M. Chrislyn Seedorf, DPM [facility]  Dr. Edrick Kins, DPM    [address], [place] [address]                Office [phone]  Fax [phone]

## 2022-03-27 DIAGNOSIS — S63501A Unspecified sprain of right wrist, initial encounter: Secondary | ICD-10-CM | POA: Diagnosis not present

## 2022-05-07 DIAGNOSIS — H52223 Regular astigmatism, bilateral: Secondary | ICD-10-CM | POA: Diagnosis not present

## 2023-03-03 ENCOUNTER — Encounter: Payer: Self-pay | Admitting: Emergency Medicine

## 2023-03-03 ENCOUNTER — Ambulatory Visit
Admission: EM | Admit: 2023-03-03 | Discharge: 2023-03-03 | Disposition: A | Discharge status: Home / Self Care | Attending: Emergency Medicine | Admitting: Emergency Medicine

## 2023-03-03 ENCOUNTER — Other Ambulatory Visit: Payer: Self-pay

## 2023-03-03 DIAGNOSIS — R519 Headache, unspecified: Secondary | ICD-10-CM

## 2023-03-03 DIAGNOSIS — M542 Cervicalgia: Secondary | ICD-10-CM | POA: Diagnosis not present

## 2023-03-03 MED ORDER — KETOROLAC TROMETHAMINE 30 MG/ML IJ SOLN
30.0000 mg | Freq: Once | INTRAMUSCULAR | Status: AC
Start: 2023-03-03 — End: 2023-03-03
  Administered 2023-03-03: 30 mg via INTRAMUSCULAR

## 2023-03-03 MED ORDER — CYCLOBENZAPRINE HCL 10 MG PO TABS: 10.0000 mg | ORAL_TABLET | Freq: Two times a day (BID) | ORAL | 0 refills | Status: AC | PRN

## 2023-03-03 NOTE — ED Triage Notes (Signed)
 Patient presents to [facility] for evaluation of headache, hx of migraines.  States the last time she had a headache like this she ended up in the hospital.  Patient states she is concerned for "an infection back there" (pointing to her neck and posterior head)  that might be causing it.  AxOx4

## 2023-03-03 NOTE — ED Provider Notes (Addendum)
 Brandi Higgins    CSN: [id number] Arrival date & time: 03/03/23  0805      History   Chief Complaint Chief Complaint  Patient presents with   Headache    HPI Brandi Higgins is a 33 y.o. female.   Patient presents for evaluation of a constant headache present to the right posterior eyes for 2 days.  Has not worsened but has not improved.  Described as" head bouncing around".  Able to palpate location in the back of the head and elicit pain.  Over the last 2 to 3 days has experienced mild chest congestion productive cough.  Symptoms exacerbated when bending over causing sharp shooting pains.  Denies light sensitivity, noise sensitivity, nausea, vomiting vision disturbance, dizziness, lightheadedness or speech changes.  History of migraines, seems different than baseline.  Has attempted a muscle relaxer which has been helpful.  Known sick contacts in household.  Denies ear pain, sore throat, nasal congestion, fever.   Past Medical History:  Diagnosis Date   Abnormal Pap smear of vagina    Gestational diabetes    Heart murmur    History of ectopic pregnancy    Hypertension    Migraines    Swelling     Patient Active Problem List   Diagnosis Date Noted   Encounter for elective induction of labor 07/22/2021   Obesity affecting pregnancy in third trimester 04/14/2021   Supervision of normal pregnancy 01/19/2021   Migraine without aura and without status migrainosus, not intractable 01/04/2014    Past Surgical History:  Procedure Laterality Date   SHOULDER SURGERY     WISDOM TOOTH EXTRACTION     WRIST SURGERY      OB History     Gravida  3   Para  2   Term  2   Preterm  0   AB  1   Living  2      SAB  0   IAB  0   Ectopic  1   Multiple  0   Live Births  2            Home Medications    Prior to Admission medications   Medication Sig Start Date End Date Taking? Authorizing Provider  acetaminophen (TYLENOL) 500 MG tablet Take 2 tablets  (1,000 mg total) by mouth every 6 (six) hours. 07/24/21   Janyce Llanos, CNM  benzocaine-Menthol (DERMOPLAST) 20-0.5 % AERO Apply 1 Application topically as needed for irritation (perineal discomfort). 07/24/21   Janyce Llanos, CNM  ferrous sulfate 325 (65 FE) MG tablet Take 1 tablet (325 mg total) by mouth 2 (two) times daily with a meal. 07/24/21   Wilson, Marny Lowenstein, CNM  ibuprofen (ADVIL) 800 MG tablet Take 1 tablet (800 mg total) by mouth every 8 (eight) hours as needed. 02/06/22   Felecia Shelling, DPM  methylPREDNISolone (MEDROL DOSEPAK) 4 MG TBPK tablet 6 day dose pack - take as directed 02/06/22   Felecia Shelling, DPM  Prenatal 27-1 MG TABS Take by mouth.    [provider]  witch hazel-glycerin (TUCKS) pad Apply 1 Application topically as needed for hemorrhoids. 07/24/21   Janyce Llanos, CNM    Family History Family History  Problem Relation Age of Onset   Diabetes Maternal Grandmother    Diabetes Paternal Grandmother    Cancer Neg Hx    Heart disease Neg Hx     Social History Social History   Tobacco Use  Smoking status: Never   Smokeless tobacco: Never  Vaping Use   Vaping status: Never Used  Substance Use Topics   Alcohol use: Yes    Comment: occas   Drug use: No     Allergies   Patient has no known allergies.   Review of Systems Review of Systems   Physical Exam Triage Vital Signs ED Triage Vitals  Encounter Vitals Group     BP 03/03/23 0851 127/84     Systolic BP Percentile --      Diastolic BP Percentile --      Pulse Rate 03/03/23 0851 (!) 108     Resp 03/03/23 0851 18     Temp 03/03/23 0851 98 F (36.7 C)     Temp Source 03/03/23 0851 Temporal     SpO2 03/03/23 0851 98 %     Weight --      Height --      Head Circumference --      Peak Flow --      Pain Score 03/03/23 0852 9     Pain Loc --      Pain Education --      Exclude from Growth Chart --    No data found.  Updated Vital Signs BP 127/84   Pulse  (!) 108   Temp 98 F (36.7 C) (Temporal)   Resp 18   SpO2 98%   Visual Acuity Right Eye Distance:   Left Eye Distance:   Bilateral Distance:    Right Eye Near:   Left Eye Near:    Bilateral Near:     Physical Exam Constitutional:      Appearance: Normal appearance.  HENT:     Head: Normocephalic.     Nose: No congestion.     Right Sinus: No maxillary sinus tenderness or frontal sinus tenderness.     Left Sinus: No maxillary sinus tenderness or frontal sinus tenderness.  Eyes:     Extraocular Movements: Extraocular movements intact.     Conjunctiva/sclera: Conjunctivae normal.     Pupils: Pupils are equal, round, and reactive to light.  Cardiovascular:     Rate and Rhythm: Normal rate and regular rhythm.     Pulses: Normal pulses.     Heart sounds: Normal heart sounds.  Pulmonary:     Effort: Pulmonary effort is normal.     Breath sounds: Normal breath sounds.  Neurological:     General: No focal deficit present.     Mental Status: She is alert and oriented to person, place, and time. Mental status is at baseline.      UC Treatments / Results  Labs (all labs ordered are listed, but only abnormal results are displayed) Labs Reviewed  POC COVID19/FLU A&B COMBO    EKG   Radiology No results found.  Procedures Procedures (including critical care time)  Medications Ordered in UC Medications - No data to display  Initial Impression / Assessment and Plan / UC Course  I have reviewed the triage vital signs and the nursing notes.  Pertinent labs & imaging results that were available during my care of the patient were reviewed by me and considered in my medical decision making (see chart for details).  Bad headache, neck pain  Vitals are stable, patient in no signs of distress nontoxic-appearing, COVID and flu test negative, pain reproducible to the neck most likely exacerbating headache, neurological deficits, no known injury therefore deferring imaging,  discussed this with patient, Toradol IM given, has  had success with in the past, prescribed Flexeril recommended additional supportive measures with follow-up as needed Final Clinical Impressions(s) / UC Diagnoses   Final diagnoses:  None   Discharge Instructions   None    ED Prescriptions   None    PDMP not reviewed this encounter.   Valinda Hoar, NP 03/03/23 0921    Valinda Hoar, NP 03/03/23 [phone]

## 2023-03-03 NOTE — Discharge Instructions (Addendum)
 At this time I believe that her headache is more muscular as we are able to cause pain on pressing the side of the neck, neck tension can exacerbate headaches  COVID and flu test are negative, completed as you are experiencing chest congestion  You were given an injection of Toradol to help with headache and neck pain, this helps to reduce inflammation and helps with pain  You may continue use of muscle relaxant, may use twice daily as needed for comfort  Can use of Tylenol and or Motrin as needed for pain  May use ice or heat over the affected area 10 to 15-minute intervals  May massage and stretch as tolerated  If you have any further concerns we follow-up for reevaluation

## 2024-02-04 ENCOUNTER — Other Ambulatory Visit: Payer: Self-pay | Admitting: Obstetrics and Gynecology

## 2024-02-04 DIAGNOSIS — Z3490 Encounter for supervision of normal pregnancy, unspecified, unspecified trimester: Secondary | ICD-10-CM
